# Patient Record
Sex: Female | Born: 1935 | Race: White | Hispanic: No | Marital: Married | Smoking: Never smoker
Health system: Southern US, Community
[De-identification: ages and names within clinical notes are randomized; demographics above are authoritative.]

## PROBLEM LIST (undated history)

## (undated) DIAGNOSIS — E1140 Type 2 diabetes mellitus with diabetic neuropathy, unspecified: Secondary | ICD-10-CM

## (undated) DIAGNOSIS — J449 Chronic obstructive pulmonary disease, unspecified: Secondary | ICD-10-CM

## (undated) DIAGNOSIS — K219 Gastro-esophageal reflux disease without esophagitis: Secondary | ICD-10-CM

## (undated) DIAGNOSIS — G459 Transient cerebral ischemic attack, unspecified: Secondary | ICD-10-CM

## (undated) DIAGNOSIS — E119 Type 2 diabetes mellitus without complications: Secondary | ICD-10-CM

## (undated) DIAGNOSIS — G709 Myoneural disorder, unspecified: Secondary | ICD-10-CM

## (undated) DIAGNOSIS — I2510 Atherosclerotic heart disease of native coronary artery without angina pectoris: Secondary | ICD-10-CM

## (undated) DIAGNOSIS — E785 Hyperlipidemia, unspecified: Secondary | ICD-10-CM

## (undated) HISTORY — DX: Hyperlipidemia, unspecified: E78.5

## (undated) HISTORY — PX: JOINT REPLACEMENT: SHX530

## (undated) HISTORY — PX: BACK SURGERY: SHX140

---
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
## 1973-12-10 HISTORY — PX: ABDOMINAL HYSTERECTOMY: SHX81

## 1998-03-21 ENCOUNTER — Ambulatory Visit (HOSPITAL_COMMUNITY): Admission: RE | Admit: 1998-03-21 | Discharge: 1998-03-21 | Payer: Self-pay | Admitting: Internal Medicine

## 1998-10-13 ENCOUNTER — Other Ambulatory Visit: Admission: RE | Admit: 1998-10-13 | Discharge: 1998-10-13 | Payer: Self-pay | Admitting: *Deleted

## 1999-03-27 ENCOUNTER — Ambulatory Visit (HOSPITAL_COMMUNITY): Admission: RE | Admit: 1999-03-27 | Discharge: 1999-03-27 | Payer: Self-pay | Admitting: *Deleted

## 1999-08-02 ENCOUNTER — Inpatient Hospital Stay (HOSPITAL_COMMUNITY): Admission: AD | Admit: 1999-08-02 | Discharge: 1999-08-04 | Payer: Self-pay | Admitting: Endocrinology

## 2000-01-04 ENCOUNTER — Other Ambulatory Visit: Admission: RE | Admit: 2000-01-04 | Discharge: 2000-01-04 | Payer: Self-pay | Admitting: *Deleted

## 2000-04-01 ENCOUNTER — Ambulatory Visit (HOSPITAL_COMMUNITY): Admission: RE | Admit: 2000-04-01 | Discharge: 2000-04-01 | Payer: Self-pay | Admitting: *Deleted

## 2000-10-11 ENCOUNTER — Ambulatory Visit (HOSPITAL_COMMUNITY): Admission: RE | Admit: 2000-10-11 | Discharge: 2000-10-11 | Payer: Self-pay | Admitting: *Deleted

## 2001-01-27 ENCOUNTER — Encounter (INDEPENDENT_AMBULATORY_CARE_PROVIDER_SITE_OTHER): Payer: Self-pay

## 2001-01-27 ENCOUNTER — Other Ambulatory Visit: Admission: RE | Admit: 2001-01-27 | Discharge: 2001-01-27 | Payer: Self-pay | Admitting: Gastroenterology

## 2001-03-28 ENCOUNTER — Encounter: Admission: RE | Admit: 2001-03-28 | Discharge: 2001-06-26 | Payer: Self-pay | Admitting: Endocrinology

## 2001-04-03 ENCOUNTER — Ambulatory Visit (HOSPITAL_COMMUNITY): Admission: RE | Admit: 2001-04-03 | Discharge: 2001-04-03 | Payer: Self-pay | Admitting: *Deleted

## 2002-04-07 ENCOUNTER — Ambulatory Visit (HOSPITAL_COMMUNITY): Admission: RE | Admit: 2002-04-07 | Discharge: 2002-04-07 | Payer: Self-pay | Admitting: Internal Medicine

## 2003-04-09 ENCOUNTER — Encounter: Payer: Self-pay | Admitting: Internal Medicine

## 2003-04-09 ENCOUNTER — Ambulatory Visit (HOSPITAL_COMMUNITY): Admission: RE | Admit: 2003-04-09 | Discharge: 2003-04-09 | Payer: Self-pay | Admitting: Internal Medicine

## 2003-06-23 ENCOUNTER — Encounter: Admission: RE | Admit: 2003-06-23 | Discharge: 2003-06-23 | Payer: Self-pay | Admitting: Internal Medicine

## 2003-06-23 ENCOUNTER — Encounter: Payer: Self-pay | Admitting: Internal Medicine

## 2004-04-14 ENCOUNTER — Ambulatory Visit (HOSPITAL_COMMUNITY): Admission: RE | Admit: 2004-04-14 | Discharge: 2004-04-14 | Payer: Self-pay | Admitting: Internal Medicine

## 2005-04-16 ENCOUNTER — Ambulatory Visit (HOSPITAL_COMMUNITY): Admission: RE | Admit: 2005-04-16 | Discharge: 2005-04-16 | Payer: Self-pay | Admitting: Internal Medicine

## 2006-04-18 ENCOUNTER — Ambulatory Visit (HOSPITAL_COMMUNITY): Admission: RE | Admit: 2006-04-18 | Discharge: 2006-04-18 | Payer: Self-pay | Admitting: Internal Medicine

## 2006-05-20 ENCOUNTER — Inpatient Hospital Stay (HOSPITAL_COMMUNITY): Admission: EM | Admit: 2006-05-20 | Discharge: 2006-05-22 | Payer: Self-pay | Admitting: Emergency Medicine

## 2006-05-21 ENCOUNTER — Encounter: Payer: Self-pay | Admitting: Cardiology

## 2006-05-21 ENCOUNTER — Ambulatory Visit: Payer: Self-pay | Admitting: Cardiology

## 2006-12-10 DIAGNOSIS — I2510 Atherosclerotic heart disease of native coronary artery without angina pectoris: Secondary | ICD-10-CM

## 2006-12-10 HISTORY — PX: CARDIAC CATHETERIZATION: SHX172

## 2006-12-10 HISTORY — DX: Atherosclerotic heart disease of native coronary artery without angina pectoris: I25.10

## 2006-12-20 ENCOUNTER — Encounter: Admission: RE | Admit: 2006-12-20 | Discharge: 2006-12-20 | Payer: Self-pay | Admitting: Internal Medicine

## 2007-04-21 ENCOUNTER — Ambulatory Visit (HOSPITAL_COMMUNITY): Admission: RE | Admit: 2007-04-21 | Discharge: 2007-04-21 | Payer: Self-pay | Admitting: Internal Medicine

## 2007-06-04 ENCOUNTER — Encounter: Admission: RE | Admit: 2007-06-04 | Discharge: 2007-06-04 | Payer: Self-pay | Admitting: Internal Medicine

## 2007-06-06 ENCOUNTER — Ambulatory Visit (HOSPITAL_COMMUNITY): Admission: RE | Admit: 2007-06-06 | Discharge: 2007-06-06 | Payer: Self-pay | Admitting: *Deleted

## 2007-06-19 ENCOUNTER — Ambulatory Visit (HOSPITAL_COMMUNITY): Admission: RE | Admit: 2007-06-19 | Discharge: 2007-06-19 | Payer: Self-pay | Admitting: Cardiovascular Disease

## 2007-07-03 ENCOUNTER — Ambulatory Visit: Payer: Self-pay | Admitting: Pulmonary Disease

## 2007-07-21 ENCOUNTER — Ambulatory Visit: Payer: Self-pay | Admitting: Pulmonary Disease

## 2007-07-22 ENCOUNTER — Ambulatory Visit: Payer: Self-pay | Admitting: Pulmonary Disease

## 2007-07-22 LAB — CONVERTED CEMR LAB
ALT: 16 units/L (ref 0–35)
AST: 16 units/L (ref 0–37)
Albumin: 3.1 g/dL — ABNORMAL LOW (ref 3.5–5.2)
Alkaline Phosphatase: 46 units/L (ref 39–117)
Angiotensin 1 Converting Enzyme: 29 units/L (ref 9–67)
Anti Nuclear Antibody(ANA): NEGATIVE
Basophils Absolute: 0 10*3/uL (ref 0.0–0.1)
Basophils Relative: 0.9 % (ref 0.0–1.0)
Calcium: 8.4 mg/dL (ref 8.4–10.5)
Chloride: 111 meq/L (ref 96–112)
Eosinophils Absolute: 0.1 10*3/uL (ref 0.0–0.6)
Eosinophils Relative: 1.3 % (ref 0.0–5.0)
GFR calc Af Amer: 92 mL/min
Glucose, Bld: 152 mg/dL — ABNORMAL HIGH (ref 70–99)
MCHC: 32.7 g/dL (ref 30.0–36.0)
MCV: 78.4 fL (ref 78.0–100.0)
Neutro Abs: 3.6 10*3/uL (ref 1.4–7.7)
Platelets: 184 10*3/uL (ref 150–400)
RBC: 3.88 M/uL (ref 3.87–5.11)
RDW: 17.5 % — ABNORMAL HIGH (ref 11.5–14.6)
Sed Rate: 10 mm/hr (ref 0–25)
Sodium: 142 meq/L (ref 135–145)
WBC: 5.3 10*3/uL (ref 4.5–10.5)

## 2007-08-12 ENCOUNTER — Ambulatory Visit (HOSPITAL_BASED_OUTPATIENT_CLINIC_OR_DEPARTMENT_OTHER): Admission: RE | Admit: 2007-08-12 | Discharge: 2007-08-12 | Payer: Self-pay | Admitting: Orthopaedic Surgery

## 2007-09-02 ENCOUNTER — Encounter: Admission: RE | Admit: 2007-09-02 | Discharge: 2007-12-01 | Payer: Self-pay | Admitting: Orthopaedic Surgery

## 2008-01-13 ENCOUNTER — Ambulatory Visit (HOSPITAL_COMMUNITY): Admission: RE | Admit: 2008-01-13 | Discharge: 2008-01-13 | Payer: Self-pay | Admitting: Cardiovascular Disease

## 2008-01-26 ENCOUNTER — Ambulatory Visit (HOSPITAL_COMMUNITY): Admission: RE | Admit: 2008-01-26 | Discharge: 2008-01-26 | Payer: Self-pay | Admitting: Cardiovascular Disease

## 2008-03-13 IMAGING — CT CT ANGIO CHEST
2 of 5 series · 18 of 36 positions shown · IV contrast (omnipaque)
Comparison: none

CLINICAL DATA: 70 year-old-female with shortness of breath and chest pain.  Evaluate for pulmonary embolism. 
CT ANGIOGRAPHY OF CHEST ? 06/19/07:
TECHNIQUE: Multidetector CT imaging of the chest was performed during bolus injection of intravenous contrast.  Multiplanar CT angiographic image reconstructions were generated to evaluate the vascular anatomy.
Contrast:  120 cc Omnipaque 300.

[Series 3: pe · axial · 0.81mm/px · z∈[-316,-28]mm · 15 of 262 slices shown]
[im 16/262  lung]
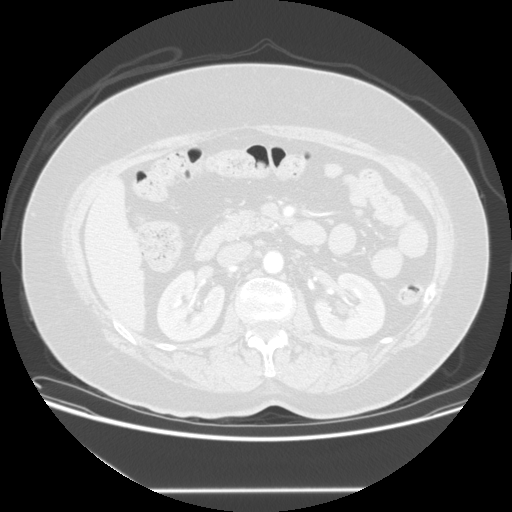
[im 31/262  mediastinal]
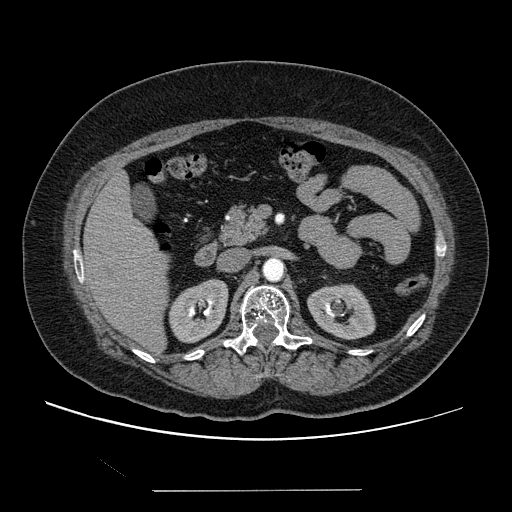
[im 47/262  lung]
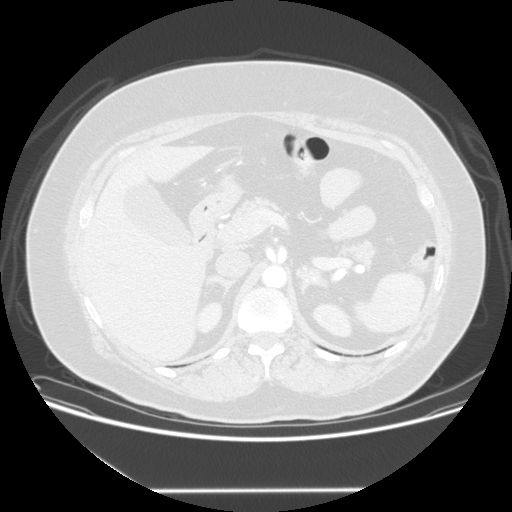
[im 62/262  mediastinal]
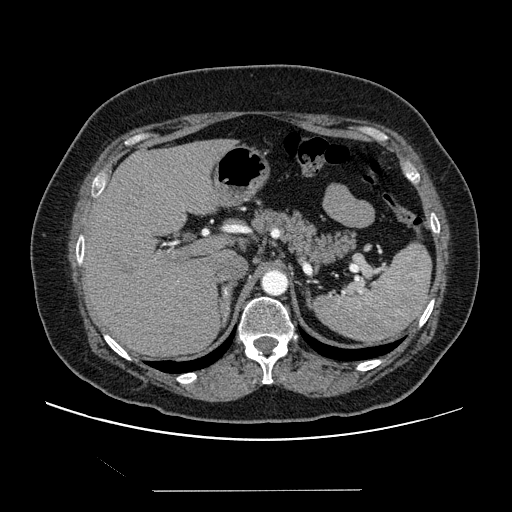
[im 77/262  lung]
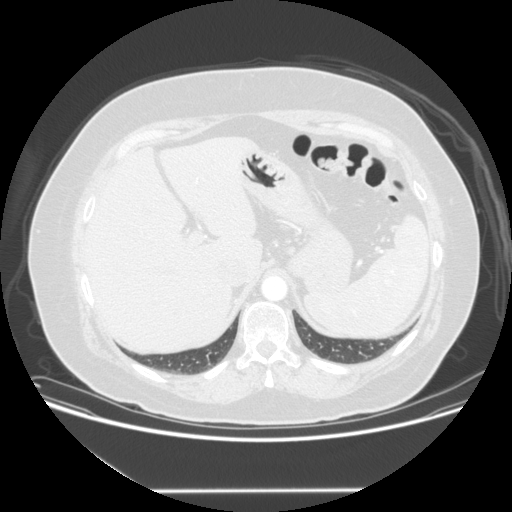
[im 93/262  mediastinal]
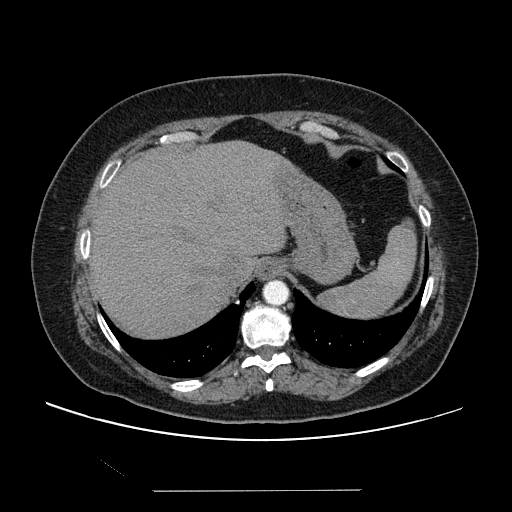
[im 108/262  lung]
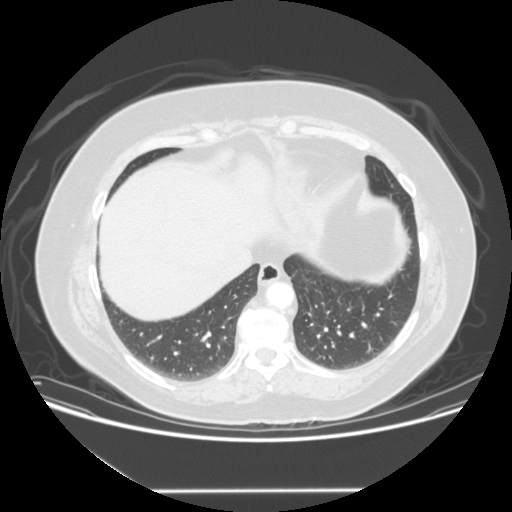
[im 139/262  mediastinal]
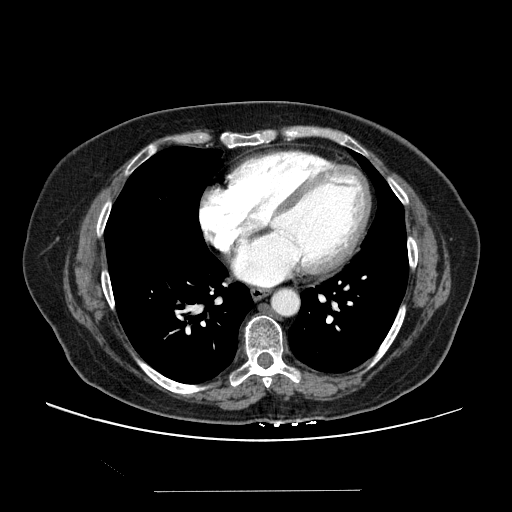
[im 154/262  lung]
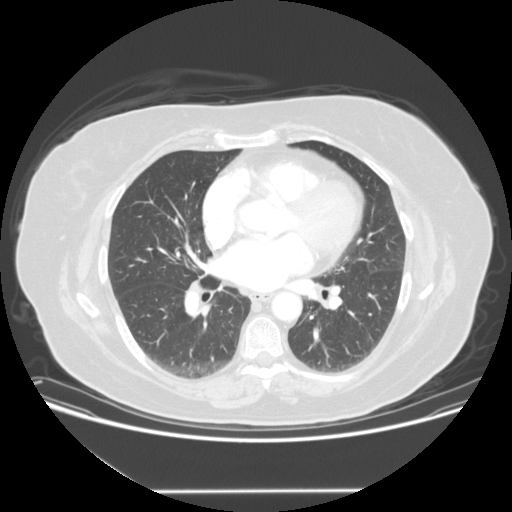
[im 169/262  mediastinal]
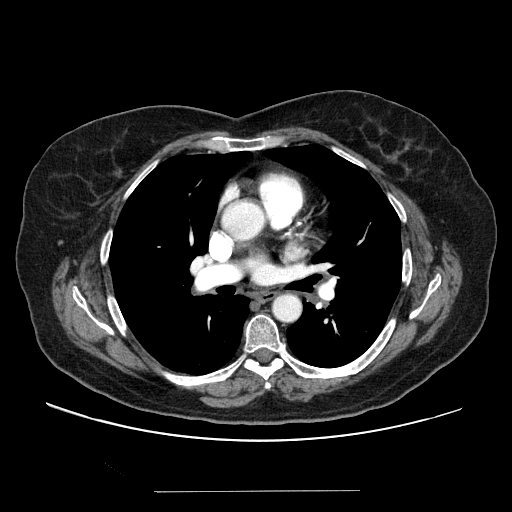
[im 185/262  lung]
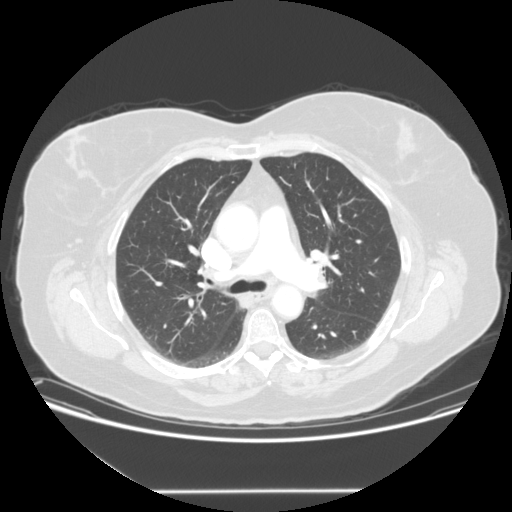
[im 200/262  mediastinal]
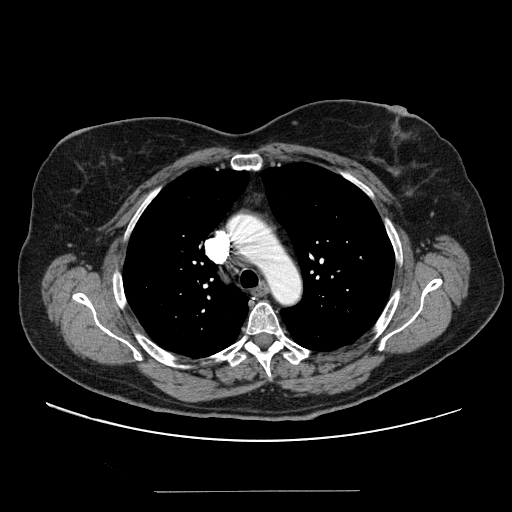
[im 215/262  lung]
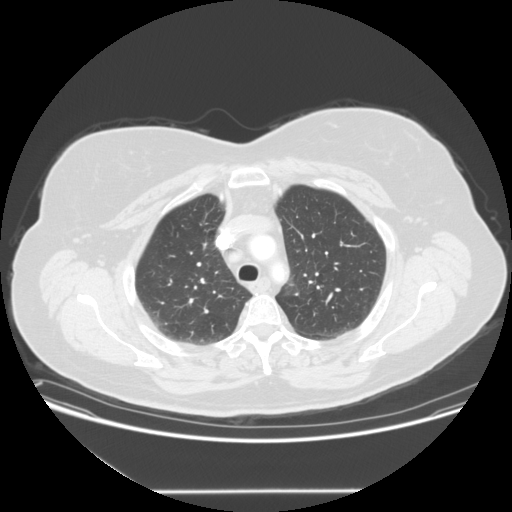
[im 231/262  mediastinal]
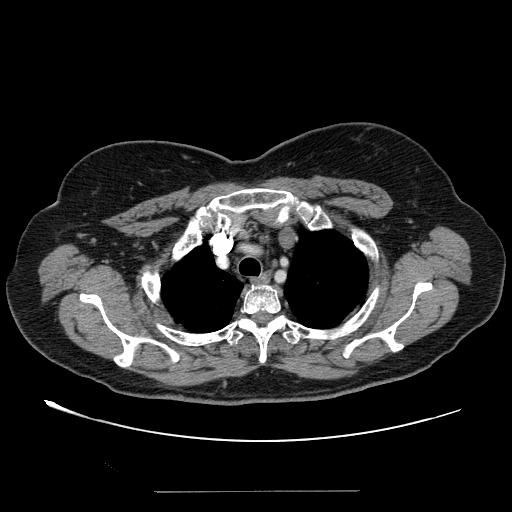
[im 246/262  lung]
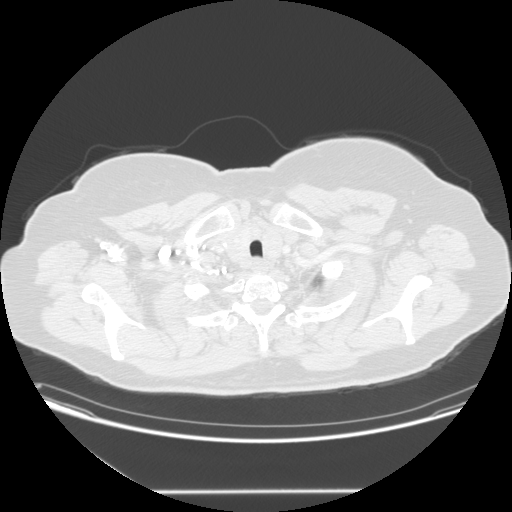

[Series 301: coronal · coronal · 0.81mm/px · 3 of 133 slices shown]
[im 27/133  mediastinal]
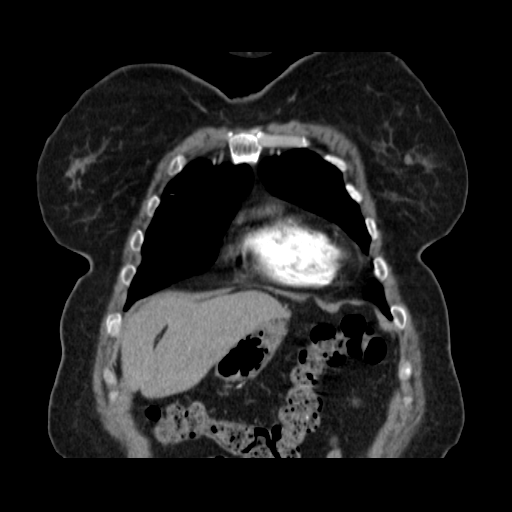
[im 53/133  mediastinal]
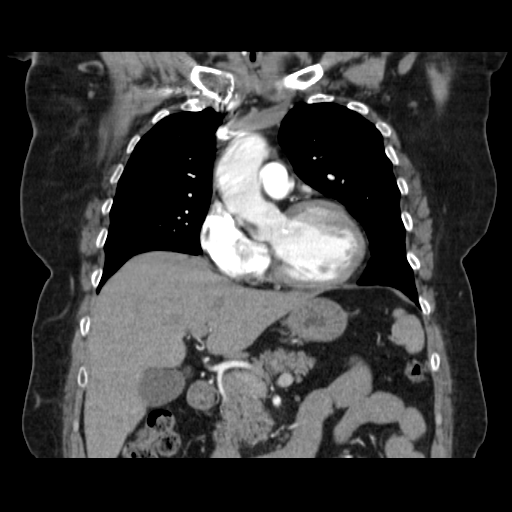
[im 80/133  mediastinal]
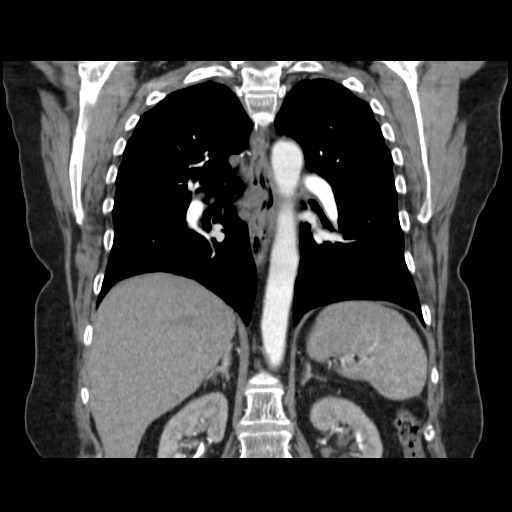

[18 of 36 positions shown; findings below may reference images not displayed]

FINDINGS: Incidentally, the patient has a common origin in the left common carotid artery and the right innominate artery consistent with a bovine arch.  This is a normal anatomic variant.   Otherwise the aorta has a normal configuration without dissection or aneurysmal dilatation.   There appears to be a fat containing lesion involving the right adrenal gland, probably representing a myelolipoma. There is also a small nodule containing a calcification and low density involving the left adrenal gland that measures 1.2 cm.  This is left adrenal lesion is indeterminate and could represent an area of old hemorrhage.   central area of low density suggests that this could represent a very small myelolipoma.   Suggestion for left peripelvic cysts.  Thyroid tissue is slightly prominent and extends into the substernal region.  No evidence to suggest a pulmonary embolism.  No significant chest lymphadenopathy.  No significant pericardial or pleural effusions.  The right adrenal myelolipoma measures 1.7 cm.  
The trachea and mainstem bronchi are patent.   Calcification in the right lung base.  Punctate nodule noted in the right major fissure measuring 3-4 mm on sequence 5, image #51.  There is also a nodule noted in the anterior right upper lobe measuring 6 mm on sequence 5, image #47.   Additional punctate nodule in the right apex on sequence 5, image #17.  A 5-6 mm nodule along the left major fissure on image 49, sequence 5.    Probable hemangioma involving the posterior body of L1.  Otherwise, there are no acute bone abnormalities.   There is some loss of bone along the posterior aspect of the L1 vertebral body which is probably related to this hemangioma.
IMPRESSION: 1.  Negative for pulmonary embolism. 
2.  Bilateral adrenal nodules.  most consistent with a myellipoma and the left adrenal  lesion is indeterminate. 
3.  Scattered small pulmonary nodules throughout the lungs.  These are nonspecific findings. Recommend a 3-6 month follow-up.

## 2008-04-23 ENCOUNTER — Ambulatory Visit (HOSPITAL_COMMUNITY): Admission: RE | Admit: 2008-04-23 | Discharge: 2008-04-23 | Payer: Self-pay | Admitting: Internal Medicine

## 2009-01-21 ENCOUNTER — Inpatient Hospital Stay (HOSPITAL_COMMUNITY): Admission: RE | Admit: 2009-01-21 | Discharge: 2009-02-02 | Payer: Self-pay | Admitting: Orthopaedic Surgery

## 2009-02-01 ENCOUNTER — Ambulatory Visit: Payer: Self-pay | Admitting: Surgery

## 2009-02-01 ENCOUNTER — Encounter (INDEPENDENT_AMBULATORY_CARE_PROVIDER_SITE_OTHER): Payer: Self-pay | Admitting: Orthopaedic Surgery

## 2009-03-21 ENCOUNTER — Encounter: Admission: RE | Admit: 2009-03-21 | Discharge: 2009-06-19 | Payer: Self-pay | Admitting: Orthopaedic Surgery

## 2009-05-04 ENCOUNTER — Ambulatory Visit (HOSPITAL_COMMUNITY): Admission: RE | Admit: 2009-05-04 | Discharge: 2009-05-04 | Payer: Self-pay | Admitting: Internal Medicine

## 2009-06-20 ENCOUNTER — Encounter: Admission: RE | Admit: 2009-06-20 | Discharge: 2009-08-08 | Payer: Self-pay | Admitting: Orthopaedic Surgery

## 2009-08-04 ENCOUNTER — Inpatient Hospital Stay (HOSPITAL_COMMUNITY): Admission: RE | Admit: 2009-08-04 | Discharge: 2009-08-10 | Payer: Self-pay | Admitting: Orthopaedic Surgery

## 2009-08-08 ENCOUNTER — Encounter (INDEPENDENT_AMBULATORY_CARE_PROVIDER_SITE_OTHER): Payer: Self-pay | Admitting: Orthopaedic Surgery

## 2009-09-26 ENCOUNTER — Encounter: Admission: RE | Admit: 2009-09-26 | Discharge: 2009-12-08 | Payer: Self-pay | Admitting: Orthopaedic Surgery

## 2009-12-08 ENCOUNTER — Encounter
Admission: RE | Admit: 2009-12-08 | Discharge: 2010-03-08 | Payer: Self-pay | Source: Home / Self Care | Admitting: Orthopaedic Surgery

## 2010-05-05 ENCOUNTER — Ambulatory Visit (HOSPITAL_COMMUNITY): Admission: RE | Admit: 2010-05-05 | Discharge: 2010-05-05 | Payer: Self-pay | Admitting: Internal Medicine

## 2010-12-13 ENCOUNTER — Ambulatory Visit (HOSPITAL_COMMUNITY)
Admission: RE | Admit: 2010-12-13 | Discharge: 2010-12-13 | Payer: Self-pay | Source: Home / Self Care | Attending: Internal Medicine | Admitting: Internal Medicine

## 2011-03-16 LAB — GLUCOSE, CAPILLARY: Glucose-Capillary: 174 mg/dL — ABNORMAL HIGH (ref 70–99)

## 2011-03-16 LAB — PROTIME-INR
INR: 2.3 — ABNORMAL HIGH (ref 0.00–1.49)
Prothrombin Time: 25.1 seconds — ABNORMAL HIGH (ref 11.6–15.2)

## 2011-03-17 LAB — URINALYSIS, MICROSCOPIC ONLY
Glucose, UA: 500 mg/dL — AB
Hgb urine dipstick: NEGATIVE
Protein, ur: NEGATIVE mg/dL

## 2011-03-17 LAB — BASIC METABOLIC PANEL
BUN: 15 mg/dL (ref 6–23)
BUN: 9 mg/dL (ref 6–23)
BUN: 9 mg/dL (ref 6–23)
CO2: 28 mEq/L (ref 19–32)
CO2: 28 mEq/L (ref 19–32)
Calcium: 7.7 mg/dL — ABNORMAL LOW (ref 8.4–10.5)
Calcium: 8 mg/dL — ABNORMAL LOW (ref 8.4–10.5)
Chloride: 103 mEq/L (ref 96–112)
Chloride: 103 mEq/L (ref 96–112)
Creatinine, Ser: 0.78 mg/dL (ref 0.4–1.2)
Creatinine, Ser: 0.86 mg/dL (ref 0.4–1.2)
Creatinine, Ser: 0.95 mg/dL (ref 0.4–1.2)
GFR calc Af Amer: >60 mL/min (ref >60)
GFR calc non Af Amer: >60 mL/min (ref >60)
GFR calc non Af Amer: >60 mL/min (ref >60)
Glucose, Bld: 181 mg/dL — ABNORMAL HIGH (ref 70–99)
Glucose, Bld: 211 mg/dL — ABNORMAL HIGH (ref 70–99)
Glucose, Bld: 253 mg/dL — ABNORMAL HIGH (ref 70–99)
Potassium: 4.5 mEq/L (ref 3.5–5.1)
Sodium: 137 mEq/L (ref 135–145)

## 2011-03-17 LAB — CBC
HCT: 24.1 % — ABNORMAL LOW (ref 36.0–46.0)
HCT: 30.4 % — ABNORMAL LOW (ref 36.0–46.0)
HCT: 41.5 % (ref 36.0–46.0)
Hemoglobin: 9.7 g/dL — ABNORMAL LOW (ref 12.0–15.0)
MCHC: 33.5 g/dL (ref 30.0–36.0)
MCHC: 33.9 g/dL (ref 30.0–36.0)
MCHC: 34 g/dL (ref 30.0–36.0)
MCV: 101.2 fL — ABNORMAL HIGH (ref 78.0–100.0)
MCV: 101.6 fL — ABNORMAL HIGH (ref 78.0–100.0)
Platelets: 103 10*3/uL — ABNORMAL LOW (ref 150–400)
Platelets: 109 10*3/uL — ABNORMAL LOW (ref 150–400)
Platelets: 124 10*3/uL — ABNORMAL LOW (ref 150–400)
Platelets: 154 10*3/uL (ref 150–400)
RBC: 2.82 MIL/uL — ABNORMAL LOW (ref 3.87–5.11)
RDW: 13.7 % (ref 11.5–15.5)
RDW: 14.6 % (ref 11.5–15.5)
RDW: 15.4 % (ref 11.5–15.5)
WBC: 5.5 10*3/uL (ref 4.0–10.5)

## 2011-03-17 LAB — GLUCOSE, CAPILLARY
Glucose-Capillary: 117 mg/dL — ABNORMAL HIGH (ref 70–99)
Glucose-Capillary: 118 mg/dL — ABNORMAL HIGH (ref 70–99)
Glucose-Capillary: 136 mg/dL — ABNORMAL HIGH (ref 70–99)
Glucose-Capillary: 139 mg/dL — ABNORMAL HIGH (ref 70–99)
Glucose-Capillary: 141 mg/dL — ABNORMAL HIGH (ref 70–99)
Glucose-Capillary: 151 mg/dL — ABNORMAL HIGH (ref 70–99)
Glucose-Capillary: 151 mg/dL — ABNORMAL HIGH (ref 70–99)
Glucose-Capillary: 156 mg/dL — ABNORMAL HIGH (ref 70–99)
Glucose-Capillary: 165 mg/dL — ABNORMAL HIGH (ref 70–99)
Glucose-Capillary: 172 mg/dL — ABNORMAL HIGH (ref 70–99)
Glucose-Capillary: 214 mg/dL — ABNORMAL HIGH (ref 70–99)
Glucose-Capillary: 215 mg/dL — ABNORMAL HIGH (ref 70–99)
Glucose-Capillary: 282 mg/dL — ABNORMAL HIGH (ref 70–99)

## 2011-03-17 LAB — ABO/RH: ABO/RH(D): A POS

## 2011-03-17 LAB — CROSSMATCH

## 2011-03-17 LAB — CARDIAC PANEL(CRET KIN+CKTOT+MB+TROPI)
CK, MB: 0.5 ng/mL (ref 0.3–4.0)
CK, MB: 0.9 ng/mL (ref 0.3–4.0)
Relative Index: INVALID (ref 0.0–2.5)
Relative Index: INVALID (ref 0.0–2.5)
Total CK: 30 U/L (ref 7–177)
Total CK: 36 U/L (ref 7–177)
Troponin I: 0.01 ng/mL (ref 0.00–0.06)
Troponin I: <0.01 ng/mL (ref 0.00–0.06)

## 2011-03-17 LAB — PROTIME-INR
INR: 1.8 — ABNORMAL HIGH (ref 0.00–1.49)
INR: 2 — ABNORMAL HIGH (ref 0.00–1.49)
INR: 2.2 — ABNORMAL HIGH (ref 0.00–1.49)
INR: 2.6 — ABNORMAL HIGH (ref 0.00–1.49)
Prothrombin Time: 22.7 seconds — ABNORMAL HIGH (ref 11.6–15.2)
Prothrombin Time: 27.6 seconds — ABNORMAL HIGH (ref 11.6–15.2)

## 2011-03-17 LAB — PREPARE RBC (CROSSMATCH)

## 2011-03-17 LAB — TRANSFUSION REACTION

## 2011-03-17 LAB — D-DIMER, QUANTITATIVE: D-Dimer, Quant: 1.27 ug/mL-FEU — ABNORMAL HIGH (ref 0.00–0.48)

## 2011-03-27 LAB — CBC
HCT: 23.2 % — ABNORMAL LOW (ref 36.0–46.0)
HCT: 27.9 % — ABNORMAL LOW (ref 36.0–46.0)
HCT: 29.6 % — ABNORMAL LOW (ref 36.0–46.0)
Hemoglobin: 14.8 g/dL (ref 12.0–15.0)
Hemoglobin: 8 g/dL — ABNORMAL LOW (ref 12.0–15.0)
Hemoglobin: 9.4 g/dL — ABNORMAL LOW (ref 12.0–15.0)
MCHC: 34.5 g/dL (ref 30.0–36.0)
MCHC: 34.6 g/dL (ref 30.0–36.0)
MCHC: 34.9 g/dL (ref 30.0–36.0)
MCV: 97.9 fL (ref 78.0–100.0)
Platelets: 100 10*3/uL — ABNORMAL LOW (ref 150–400)
Platelets: 112 10*3/uL — ABNORMAL LOW (ref 150–400)
Platelets: 141 10*3/uL — ABNORMAL LOW (ref 150–400)
Platelets: 126 10*3/uL — ABNORMAL LOW (ref 150–400)
Platelets: 208 10*3/uL (ref 150–400)
Platelets: 300 10*3/uL (ref 150–400)
RBC: 2.37 MIL/uL — ABNORMAL LOW (ref 3.87–5.11)
RBC: 2.85 MIL/uL — ABNORMAL LOW (ref 3.87–5.11)
RBC: 2.72 MIL/uL — ABNORMAL LOW (ref 3.87–5.11)
RBC: 2.82 MIL/uL — ABNORMAL LOW (ref 3.87–5.11)
RDW: 14.3 % (ref 11.5–15.5)
RDW: 14.8 % (ref 11.5–15.5)
WBC: 5.5 10*3/uL (ref 4.0–10.5)
WBC: 5.1 10*3/uL (ref 4.0–10.5)
WBC: 5.8 10*3/uL (ref 4.0–10.5)
WBC: 7 10*3/uL (ref 4.0–10.5)

## 2011-03-27 LAB — GLUCOSE, CAPILLARY
Glucose-Capillary: 131 mg/dL — ABNORMAL HIGH (ref 70–99)
Glucose-Capillary: 138 mg/dL — ABNORMAL HIGH (ref 70–99)
Glucose-Capillary: 140 mg/dL — ABNORMAL HIGH (ref 70–99)
Glucose-Capillary: 146 mg/dL — ABNORMAL HIGH (ref 70–99)
Glucose-Capillary: 151 mg/dL — ABNORMAL HIGH (ref 70–99)
Glucose-Capillary: 157 mg/dL — ABNORMAL HIGH (ref 70–99)
Glucose-Capillary: 166 mg/dL — ABNORMAL HIGH (ref 70–99)
Glucose-Capillary: 172 mg/dL — ABNORMAL HIGH (ref 70–99)
Glucose-Capillary: 185 mg/dL — ABNORMAL HIGH (ref 70–99)
Glucose-Capillary: 194 mg/dL — ABNORMAL HIGH (ref 70–99)
Glucose-Capillary: 112 mg/dL — ABNORMAL HIGH (ref 70–99)
Glucose-Capillary: 112 mg/dL — ABNORMAL HIGH (ref 70–99)
Glucose-Capillary: 112 mg/dL — ABNORMAL HIGH (ref 70–99)
Glucose-Capillary: 116 mg/dL — ABNORMAL HIGH (ref 70–99)
Glucose-Capillary: 118 mg/dL — ABNORMAL HIGH (ref 70–99)
Glucose-Capillary: 120 mg/dL — ABNORMAL HIGH (ref 70–99)
Glucose-Capillary: 121 mg/dL — ABNORMAL HIGH (ref 70–99)
Glucose-Capillary: 121 mg/dL — ABNORMAL HIGH (ref 70–99)
Glucose-Capillary: 123 mg/dL — ABNORMAL HIGH (ref 70–99)
Glucose-Capillary: 124 mg/dL — ABNORMAL HIGH (ref 70–99)
Glucose-Capillary: 141 mg/dL — ABNORMAL HIGH (ref 70–99)
Glucose-Capillary: 150 mg/dL — ABNORMAL HIGH (ref 70–99)
Glucose-Capillary: 152 mg/dL — ABNORMAL HIGH (ref 70–99)
Glucose-Capillary: 162 mg/dL — ABNORMAL HIGH (ref 70–99)
Glucose-Capillary: 166 mg/dL — ABNORMAL HIGH (ref 70–99)
Glucose-Capillary: 187 mg/dL — ABNORMAL HIGH (ref 70–99)
Glucose-Capillary: 57 mg/dL — ABNORMAL LOW (ref 70–99)
Glucose-Capillary: 78 mg/dL (ref 70–99)

## 2011-03-27 LAB — PROTIME-INR
INR: 1.4 (ref 0.00–1.49)
INR: 1.6 — ABNORMAL HIGH (ref 0.00–1.49)
INR: 2.1 — ABNORMAL HIGH (ref 0.00–1.49)
INR: 2.3 — ABNORMAL HIGH (ref 0.00–1.49)
INR: 2.5 — ABNORMAL HIGH (ref 0.00–1.49)
INR: 2.5 — ABNORMAL HIGH (ref 0.00–1.49)
INR: 2.8 — ABNORMAL HIGH (ref 0.00–1.49)
INR: 3.3 — ABNORMAL HIGH (ref 0.00–1.49)
INR: 3.8 — ABNORMAL HIGH (ref 0.00–1.49)
Prothrombin Time: 14.2 seconds (ref 11.6–15.2)
Prothrombin Time: 14.6 seconds (ref 11.6–15.2)
Prothrombin Time: 27.7 seconds — ABNORMAL HIGH (ref 11.6–15.2)
Prothrombin Time: 28.3 seconds — ABNORMAL HIGH (ref 11.6–15.2)
Prothrombin Time: 28.4 seconds — ABNORMAL HIGH (ref 11.6–15.2)
Prothrombin Time: 30.2 seconds — ABNORMAL HIGH (ref 11.6–15.2)
Prothrombin Time: 32 seconds — ABNORMAL HIGH (ref 11.6–15.2)
Prothrombin Time: 36.5 seconds — ABNORMAL HIGH (ref 11.6–15.2)
Prothrombin Time: 40.6 seconds — ABNORMAL HIGH (ref 11.6–15.2)

## 2011-03-27 LAB — BASIC METABOLIC PANEL
BUN: 13 mg/dL (ref 6–23)
BUN: 7 mg/dL (ref 6–23)
BUN: 8 mg/dL (ref 6–23)
CO2: 31 mEq/L (ref 19–32)
Calcium: 7.5 mg/dL — ABNORMAL LOW (ref 8.4–10.5)
Calcium: 7.7 mg/dL — ABNORMAL LOW (ref 8.4–10.5)
Calcium: 9 mg/dL (ref 8.4–10.5)
Calcium: 8.3 mg/dL — ABNORMAL LOW (ref 8.4–10.5)
Chloride: 100 mEq/L (ref 96–112)
Creatinine, Ser: 0.91 mg/dL (ref 0.4–1.2)
Creatinine, Ser: 0.84 mg/dL (ref 0.4–1.2)
GFR calc Af Amer: >60 mL/min (ref >60)
GFR calc Af Amer: >60 mL/min (ref >60)
GFR calc Af Amer: >60 mL/min (ref >60)
GFR calc non Af Amer: >60 mL/min (ref >60)
GFR calc non Af Amer: >60 mL/min (ref >60)
GFR calc non Af Amer: >60 mL/min (ref >60)
GFR calc non Af Amer: >60 mL/min (ref >60)
Glucose, Bld: 142 mg/dL — ABNORMAL HIGH (ref 70–99)
Glucose, Bld: 151 mg/dL — ABNORMAL HIGH (ref 70–99)
Potassium: 3.8 mEq/L (ref 3.5–5.1)
Potassium: 4.2 mEq/L (ref 3.5–5.1)
Sodium: 134 mEq/L — ABNORMAL LOW (ref 135–145)
Sodium: 140 mEq/L (ref 135–145)

## 2011-03-27 LAB — CROSSMATCH
ABO/RH(D): A POS
Antibody Screen: NEGATIVE

## 2011-04-24 NOTE — Cardiovascular Report (Signed)
Lisa Gardner, Lisa Gardner             ACCOUNT NO.:  [id number]   MEDICAL RECORD NO.:  [id number]          PATIENT TYPE:  OIB   LOCATION:  2899                         FACILITY:  [facility]   PHYSICIAN:  Darlin Priestly, MD  DATE OF BIRTH:  06/29/1936   DATE OF PROCEDURE:  06/06/2007  DATE OF DISCHARGE:                            CARDIAC CATHETERIZATION   PROCEDURES:  1. Left heart catheterization.  2. Coronary angiography.  3. Left ventriculogram.   COMPLICATIONS:  None.   INDICATIONS:  [name] is a 75 year old female patient of Dr. Garner Nash and Dr.  Daphene Jaeger with a history of diabetes, hypertension,  history of cardiac catheterization November 2005 revealing mild left  main disease of 50% mid RCA and normal EF.  Over the last several weeks,  she has noted increasing shortness of breath with chest tightness and  lower extremity edema.  She did undergo lower extremity venous Doppler  in the office yesterday which ruled out DVT.  She is now brought for  cardiac catheterization to reassess her coronary anatomy.   DESCRIPTION OF OPERATION:  After obtaining informed consent, the patient  was brought to the cardiac cath lab and right and left groin shaved,  prepped and draped in usual sterile fashion.  Monitors were established.  Using modified Seldinger technique, a #6-French anterior sheath was  inserted into the right femoral artery.  A 6-French diagnostic catheter  was used to perform diagnostic angiography.   Left main is a large vessel with mild 30% ostial narrowing.   The LAD is a medium-size vessel coursing back to one diagonal branch.  The LAD had mild proximal calcification with no high-grade stenosis.   First diagonal is a medium-size vessel which bifurcates distally with a  40% ostial lesion.   Left circumflex is large vessel coursing the AV groove and goes to three  obtuse marginal branches.  The AV circumflex has no significant disease.   First OM is a medium  sized vessel which bifurcates distally with no  significant disease.   Second OM is a small to medium-size vessel with no significant disease.   Third OM is a medium-size vessel with no significant disease.   The right coronary artery is a large vessel which is dominate.  It gives  rise to both PDA as well posterolateral branch.  There is 50% mid RCA  narrowing, but no further high-grade stenosis.  The PDA and  posterolateral branch have no significant disease.   Left ventriculogram was a preserved EF of 60%.   HEMODYNAMIC SYSTEM:  Arterial pressure 120/52, LV system pressure 120/1,  LVDP of 8.   CONCLUSION:  1. Noncritical CAD.  2. Normal LV systolic function.      Darlin Priestly, MD  Electronically Signed     RHM/MEDQ  D:  06/06/2007  T:  06/06/2007  Job:  [id number]   cc:   Olene Craven, M.D.  Nicki Guadalajara, M.D.

## 2011-04-24 NOTE — Assessment & Plan Note (Signed)
[facility]                             PULMONARY OFFICE NOTE   [name], [name]                    MRN:          [id number]  DATE:07/29/2007                            DOB:          1936-04-06    I have reviewed Lisa Gardner's laboratory results and of note is that  her hemoglobin was 10 and her hematocrit was 30.4 and her glucose was  152.  Otherwise, the remainder of her laboratory tests were  unremarkable.  I have discussed these results with Lisa Gardner.  Certainly, there is not anything really in these lab tests which would  explain her symptoms of dyspnea.  I have advised her to follow up with  her primary care physician for further assessment of her anemia and her  hyperglycemia.     Coralyn Helling, MD  Electronically Signed    VS/MedQ  DD: 07/29/2007  DT: 07/30/2007  Job #: [id number]   cc:   Olene Craven, M.D.  Nicki Guadalajara, M.D.

## 2011-04-24 NOTE — Op Note (Signed)
Lisa Gardner, Lisa Gardner             ACCOUNT NO.:  [id number]   MEDICAL RECORD NO.:  [id number]          PATIENT TYPE:  AMB   LOCATION:  DSC                          FACILITY:  [facility]   PHYSICIAN:  Vanita Panda. Magnus Ivan, M.D.DATE OF BIRTH:  12-29-1935   DATE OF PROCEDURE:  08/12/2007  DATE OF DISCHARGE:                               OPERATIVE REPORT   PREOPERATIVE DIAGNOSIS:  Right knee severe degenerative joint disease.   POSTOPERATIVE DIAGNOSES:  1. Right knee severe degenerative joint disease.  2. Grade 4 chondromalacia, medial and lateral tibial plateaus.  3. Chronic posterior horn tearing, medial meniscus.  4. Chronic lateral meniscal midbody tear.  5. Grade 4 chondromalacia patella.  6. Grade 3 chondromalacia medial and lateral femoral condyles   SURGICAL PROCEDURES:  1. Right knee arthroscopy with extensive debridement.  2. Right knee arthroscopy, partial medial and lateral meniscectomies.  3. Chondroplasty right knee lateral tibial plateau and lateral femoral      condyle.   SURGEON:  Doneen Poisson, MD   ANESTHESIA:  General.   BLOOD LOSS:  Minimal.   TOURNIQUET TIME:  28 minutes.   COMPLICATIONS:  None.   INDICATIONS:  Brief Lisa Gardner is 75 year old who had debilitating  pain in both her knees.  She wears knee braces for her knees.  She has  had injections in her knees and has x-ray evidence of arthritic changes  in the knees.  She wanted to try to temporize this with arthroscopic  surgery in light of the potential for total knee arthroplasty.  She  wants to try to avoid this because she and her elderly husband care for  a disabled child at home.   PROCEDURE:  After informed consent obtained, appropriate right leg was  marked.  She was brought to the operating room, placed supine on the  operative table.  General anesthesia was then obtained.  Her right leg  was prepped and draped with DuraPrep and sterile drapes including  sterile stockinette.  A  nonsterile tourniquet had previously been placed  on her upper right thigh.  A time-out was called to identify the correct  patient, correct extremity.  I then made an anterior lateral portal just  lateral patellar tendon and then at the inferior pole of patella and a  cannula was inserted and the scope was inserted medially.  I made a  anterior medial portal just medial to the patellar tendon.  Arthroscopic  shaver was inserted from this standpoint.  You could see there was  chronic tearing of the medial meniscus that extended back to the  posterior aspect and we performed a partial medial meniscectomy using  upcutting biters as well as an arthroscopic shaver.  There was an area  of the tibial plateau on the medial side measuring approximately 5-6 mm  in diameter that showed grade 4 chondromalacia.  The lateral compartment  was assessed next and was found to have a large area of denuded  cartilage off the tibial plateau and the femoral condyle and meniscal  tearing of the lateral meniscus as well.  Of note she does have a valgus  deformity  of the knee.  With arthroscopic shaver and upcutting biters we  performed as extensive a debridement as possible including partial  lateral meniscectomy as well as debriding free edges of cartilage.  Finally the patellofemoral joint was assessed and actually the trochlea  itself was with minimal changes, but the patella on the lateral facet  had grade 4 chondromalacia changes as well.  I then allowed fluid to  lavage through the knee and removed all instrumentation and infusion was  removed from the knee and I inserted a mixture of 4 mg of morphine 0.25%  Marcaine with epinephrine into the knee.  The portal sites were each  closed with interrupted 4-0 nylon suture.  Xeroform followed by well-  padded sterile dressing was applied.  The tourniquet was inflated during  the case at 350 mm pressure, was let down at 28 minutes, toes did pink  nicely.  The  patient was awakened, extubated, taken to recovery room in  stable condition.  Postoperatively she will follow-up in the office in  two weeks and I will let her weight bear as tolerated.      Vanita Panda. Magnus Ivan, M.D.  Electronically Signed     CYB/MEDQ  D:  08/12/2007  T:  08/12/2007  Job:  [id number]

## 2011-04-24 NOTE — Assessment & Plan Note (Signed)
[facility]                             PULMONARY OFFICE NOTE   NEKA, BISE                    MRN:          [id number]  DATE:07/03/2007                            DOB:          May 07, 1936    REFERRING PHYSICIAN:  Nicki Guadalajara, M.D.   I met Ms. Callas today for evaluation of her dyspnea.   She has been having this problem for the last few months and she has  been complaining of chest tightness with occasional nausea.  She has had  an evaluation with a cardiac catheterization on June 27th which showed  normal left ventricular function and noncritical coronary artery  disease.  She was also having some leg swelling and had a Doppler study  of her legs done on June 26th which was negative for DVT.  She then had  a CT scan of her chest on July 10th which was negative for pulmonary  embolism which showed small scattered pulmonary nodules. She also had a  chest x-ray done on June 25th which does not show any active disease but  was suspicious for changes of COPD.  She says she has also had a  decrease in her appetite as well as stiffness in her joints.  She denies  having much as far as cough or sputum production.  There is no history  of hemoptysis.  She has not had any skin rashes.  There is no  significant travel history.  She did not have any exposure to animal,  pets or birds.  She has not had any recent sick exposures.  She says  that she is able to walk reasonably okay on level ground but her  problems are more so when she is walking up and down stairs.  She has  not had pulmonary function testing done before.   PAST MEDICAL HISTORY:  Significant for:  1. Diabetes.  2. Mini-stroke.   PAST SURGICAL HISTORY:  Significant for:  1. Back surgery.  2. Hysterectomy.  3. Appendectomy.  4. Tonsillectomy.   CURRENT MEDICATIONS:  1. Metformin 500 mg daily and 1000 mg b.i.d.  2. Actos 45 mg daily.  3. Lantus insulin 20 units daily.  4.  Amaryl 4 mg b.i.d.  5. Cymbalta 60 mg daily.  6. Nitro pill daily.  7. Lasix 80 mg daily.  8. Lipitor 40 mg daily.  9. Ocuvite b.i.d.  10.Neurontin 300 mg t.i.d.  11.Aspirin 325 mg daily.  12.Potassium 20 mEq daily.  13.Multivitamin daily.  14.Vitamin C daily.  15.Calcium daily.  16.Glucosamine b.i.d.   She has no known drug allergies.   SOCIAL HISTORY:  1. She is married.  2. There is no history of tobacco or alcohol use.   FAMILY HISTORY:  Significant for:  1. Her father who had colon cancer.  2. She has 2 brothers with heart disease.  3. She has a daughter with multiple sclerosis.  4. A sister with rheumatoid arthritis.  5. A mother who had polycystic kidney disease.   REVIEW OF SYSTEMS:  She does have problems snoring.   PHYSICAL EXAM:  She is 5  feet 4 inches all, 192 pounds, temperature  97.9, blood pressure 106/58, heart rate 74, oxygen saturation 97% on  room air.  HEENT:  Pupils reactive.  There is no sinus tenderness, no nasal  discharge, no oral lesions, no lymphadenopathy.  HEART:  S1, S2.  CHEST:  No wheezing or rales.  ABDOMEN:  Obese, soft, nontender.  EXTREMITIES:  No edema, cyanosis, clubbing.  NEUROLOGIC EXAM:  No focal deficits were appreciated.   IMPRESSION:  1. Dyspnea with a concern of chronic obstructive pulmonary disease      based on her chest x-ray.  However, she does not have any history      of prior tobacco use.  What I would like to do is have her undergo      pulmonary function test to further assess this and determine if she      would benefit from the use of inhaler therapy.  If her pulmonary      function tests are unremarkable then we may need to consider having      her undergo cardiopulmonary exercise testing.  2. Pulmonary nodules.  What I would recommend is followup CT scan in 3-      6 months to document stability.   I will follow up with her in approximately 2-3 weeks after I have a  chance to review her pulmonary function  test.     Coralyn Helling, MD  Electronically Signed    VS/MedQ  DD: 07/09/2007  DT: 07/10/2007  Job #: [id number]   cc:   Nicki Guadalajara, M.D.

## 2011-04-24 NOTE — Op Note (Signed)
NAMEMOLLYE, GUINTA NO.:  [id number]   MEDICAL RECORD NO.:  [id number]          PATIENT TYPE:  INP   LOCATION:  5030                         FACILITY:  [facility]   PHYSICIAN:  Vanita Panda. Magnus Ivan, M.D.DATE OF BIRTH:  February 01, 1936   DATE OF PROCEDURE:  01/21/2009  DATE OF DISCHARGE:                               OPERATIVE REPORT   PREOPERATIVE DIAGNOSIS:  Right knee severe osteoarthritis and  degenerative joint disease.   POSTOPERATIVE DIAGNOSIS:  Right knee severe osteoarthritis and  degenerative joint disease.   PROCEDURE:  Right total knee arthroplasty utilizing computer navigation.   IMPLANTS:  DePuy rotating platform knee with size 3 femur, size 3 tibial  tray, 12.5-mm polyethylene insert and 32-mm patellar button.  transcriptions put by   SURGEON:  Vanita Panda. Magnus Ivan, MD   ASSISTANT:  Wende Neighbors, PA-C   ANESTHESIA:  1. Regional right femoral block.  2. General.   ANTIBIOTICS:  Ancef 1g IV.   TOURNIQUET TIME:  1 hour and 16 minutes.   BLOOD LOSS:  100 mL.   COMPLICATIONS:  None.   INDICATIONS:  Briefly, Ms. Delair is a 75 year old female with severe  debilitating arthritis involving both her knees with the right being  worse than the left.  I have seen her for quite sometime and tried  injections in her knees and other modalities to try to calm down her  symptoms.  It has gone to where it is having a profound effect on her  activities of daily living and she wished to proceed with total knee  replacement surgery.  The risks and benefits of this were explained to  her in length including the risk of blood loss, DVT and fatal PE.   PROCEDURE DESCRIPTION:  After informed consent was obtained, the  appropriate right leg was marked and anesthesia obtained with a femoral  nerve block.  She was then brought to the operating room and placed  supine on the operating table.  General anesthesia was then obtained.  A  nonsterile tourniquet  was placed on her upper right thigh.  A Foley  catheter was then placed and then her right leg was prepped and draped  with DuraPrep and sterile drapes including sterile stockinette.  A time-  out was called to identify the correct patient and the correct right  knee.  I used an Esmarch to wrap out the leg and tourniquet was inflated  to 350 mm of pressure.  We then made a midline incision  directly over  the patella and carried this proximally and distally.  We dissected down  to the medial parapatellar arthrotomy and the effusion was encountered.  I then cleaned the knee and tibia of osteophytes using a rongeur and  osteotome.  We cleaned the soft tissue from the knee including the  medial and lateral meniscus and the ACL and PCL.  Then we proceeded with  computer navigation at this portion of the case.  Through two small  separate stab incisions in the tibia distal to her previous incision, we  placed  two Steinmann pins from an anteromedial to posterolateral  direction.  Through our main incision in the femur, we placed two  similar pins from an anteromedial to posterolateral direction in the  metaphyseal portion of the femur.  We then proceeded with the navigation  at this portion of the case and selected points throughout the tibia and  femur.  We selected rotation of the hip and then this allowed [place] for  correct sizing and soft tissue balancing of the knee.  First, we started  with making our tibial cut, we took 10 mm off the high side, again,  based off our plan with computer navigation.  Using the oscillating saw,  we were able to make a tibial cut and verified this under navigation.  I  felt that my cut was in adherence with our computer plan.  Next, we  checked this in flexion and extension and  felt that I had an adequate  space.  Next, we proceeded with our distal femoral cut and this was made  as well and balanced in flexion and extension.  We chose a size 3 for  our chamfer  cuts so the rotation off this was a slight external rotation  again in accordance with our computer plan.  We made the chamfer cuts  and then a box cut.  Next, a size 3 tibial tray was chosen and we made a  drill for the tibial keel.  I then placed a trial size 3 tibial  component followed by size 3 femoral component.  We trialed a 10-mm  polyethylene insert followed by 12.5-mm polyethylene insert.  I felt  that the 12.5 was more stable and again under navigation, we were  balancing completely in flexion and extension and our motion was felt  excellent and it was felt to be balanced in varus and valgus stressing  as well.  We drilled lugs for a size 32 patellar button and a patellar  button trial was placed and again I put towel clips with a size 12.5-mm  polyethylene insert up at 12 towel clips over the arthrotomy incision  into the knee through a range of motion again, it was felt to be stable.  All trial components were then removed and we thoroughly irrigated the  knee with pulsatile lavage using 3 L of normal saline solution.  I then  cemented the real size 3 rotating platform a tibial tray from DePuy  followed by the size 3 femur.  I then trialed a 12.5-mm polyethylene  insert.  Again, I felt this was stable so I placed the real 12.5-mm  insert.  Finally, we cemented the patellar button as well.  The knee  again showed to be balanced in flexion and extension under computer  navigation.  The Steinmann pins were then removed.  After the cement had  hardened with the tourniquet down and hemostasis was obtained with  electrocautery we then thoroughly irrigated the knee again and closed  the arthrotomy with interrupted #1 Vicryl suture followed by 2-0 Vicryl  in subcutaneous tissue and a running 4-0 Vicryl subcuticular stitch.  Steri-Strips were applied and the patient's knee was placed in a well-  padded sterile dressing.  She was awakened, extubated and taken to the  recovery room in  stable and good condition.  All final counts were  correct and there were no complications noted.      Vanita Panda. Magnus Ivan, M.D.  Electronically Signed     CYB/MEDQ  D:  01/21/2009  T:  01/21/2009  Job:  [id number]

## 2011-04-24 NOTE — Assessment & Plan Note (Signed)
[facility]                             PULMONARY OFFICE NOTE   JAYEL, SCADUTO                    MRN:          [id number]  DATE:07/22/2007                            DOB:          11/05/1936    PULMONARY FOLLOWUP NOTE   I saw Ms. Astarita today in followup for her dyspnea and pulmonary  nodules.  She states that her symptoms have been persistent as far as  feeling dyspneic with activity.   She had undergone pulmonary function test on August 11, which showed a  post-bronchodilator FEV1 to FVC ratio of 76%.  Her FEV1 was 2.28L, which  was 100% of predicted.  Her FVC was 2.99L, which was 100% of predicted.  There was the suggestion of a bronchodilator response based on her FEF25-  75.  Her total lung capacity was 5.14L, which was 102% of predicted.  Her diffusion capacity was 78% predicted, but corrected for lung volumes  was 111% of predicted.  Overall, these findings would be consistent with  essentially normal pulmonary function test with the possible suggestion  of asthma based on the change in the FEF25-75.   She does continue to also complain of arthritis and some symptoms of  weakness.   MEDICATION LIST:  Reviewed.   PHYSICAL EXAM:  She is 194 pounds, temperature 97, blood pressure  112/62, heart rate 69, oxygen saturation 99% on room air.  HEART:  S1, S2.  CHEST:  Clear to auscultation.  ABDOMEN:  Soft.  EXTREMITIES:  No edema.   IMPRESSION:  1. Dyspnea.  To date, her workup has been essentially unremarkable,      except for the possible suggestion of asthma on her spirometry,      although I am not completely convinced of this.  I will give her a      therapeutic trial of Proventil HFA 2 puffs q.i.d. p.r.n. to see if      she does get any benefit from this.  I have advised her to try to      use this prior to engaging in any exercise regimen to see if this      helps.  I also discussed with her that it would be ideal to be  able      to have her undergo a cardiopulmonary exercise test, but her      ability to perform these maneuvers would be somewhat limited based      on her arthritis.  I would also like for her to undergo further      laboratory tests to determine what type of arthritis she may be      having, and if this is contributing to some of her difficulties      with her breathing.  Ultimately I suspect her dyspnea will turn out      to be on the basis of obesity and deconditioning.   1. Pulmonary nodules.  She will need to have followup CT scan of her      chest in approximately 3 to 4 months.   I will follow up with her  in approximately 3 to 4 weeks.  I will call  her with the results of her blood tests.     Coralyn Helling, MD  Electronically Signed    VS/MedQ  DD: 07/22/2007  DT: 07/23/2007  Job #: [id number]   cc:   Nicki Guadalajara, M.D.

## 2011-04-24 NOTE — Op Note (Signed)
NAMEMarland Kitchen  Lisa Gardner, Lisa Gardner             ACCOUNT NO.:  [id number]   MEDICAL RECORD NO.:  [id number]          PATIENT TYPE:  INP   LOCATION:  0001                         FACILITY:  [facility]   PHYSICIAN:  Vanita Panda. Magnus Ivan, M.D.DATE OF BIRTH:  07/10/1936   DATE OF PROCEDURE:  08/04/2009  DATE OF DISCHARGE:                               OPERATIVE REPORT   PREOPERATIVE DIAGNOSIS:  Left knee severe osteoarthritis and  degenerative joint disease.   POSTOPERATIVE DIAGNOSIS:  Left knee severe osteoarthritis and  degenerative joint disease.   PROCEDURE:  Left total knee arthroplasty using computer navigation  guided assistance.   Implants:  DePuy Sigma rotating platform knee with size 3 femur, size 3  tibial tray, a 12.5-mm polyethylene insert, 32-mm patellar button.   SURGEON:  Vanita Panda. Magnus Ivan, MD   ASSISTANT:  Maud Deed, PAC.   ANESTHESIA:  General.   TOURNIQUET TIME:  69 minutes.   ESTIMATED BLOOD LOSS:  100-200 mL.   COMPLICATIONS:  None.   INDICATIONS:  Briefly, Lisa Gardner is 75 year old with severe  debilitating arthritis involving both her knees.  I performed a right  total knee arthroplasty in February and she did well.  She now presents  for left total knee arthroplasty.  Risks and benefits of surgery were  explained to her at length and she understood the need to proceed with  surgery as well as need to be on blood and medication.  She understood  the risk of DVT and PE.  She wished to proceed with surgery.   PROCEDURE:  After informed consent was obtained and the appropriate left  knee was marked, she was brought to the operating room and placed supine  on the operating table.  General anesthesia was then obtained.  A  nonsterile tourniquet was placed around her upper left thigh and the leg  was prepped and draped with DuraPrep and sterile drapes including  sterile stockinette.  A time-out was called to identify the correct  patient and the correct left  knee.  I then used an Esmarch to wrap out  the leg and the tourniquet inflated to 300 mm pressure.  A midline  incision was then made directly over the patella and carried proximally  and distally.  I was able to then perform a medial parapatellar  arthrotomy and dissect down to the joint.  You could see there was a  significant bone-on-bone wear of the medial compartment and grade 3 to  grade 4 changes of the lateral compartment.  I released the soft tissue  around the knee including the PCL, ACL as well as removing the meniscus.  We are able to dissect tissue from the medial side to release on the  medial side due to her varus deformity.  I next proceeded with computer  navigation portion of the case.  Through two small stab incisions in the  anterior medial tibia, I placed two Steinmann pins from an anteromedial  to a posterolateral direction.  Through my main incision up in the  metaphyseal area of the femur, I placed two Steinmann pins from  anteromedial to posterolateral.  This  allowed [place] to proceed with the  navigation portion of the case.  After picking points from the distal  tibia and fibula up to the knee and around the knee, we were able to  obtain the hip center of rotation as well and map out the knee from a  computer standpoint to be able to the obtain an appropriately aligned  and sized knee as possible.  We first then started with our tibial cut.  We took 10 mm off the high side and were able to make our cut and verify  this through our plan through navigation.  Next, we balanced with the  spacer blocks and selected our distal femoral cut at 10 mm.  This was  balanced in flexion and extension.  We next used again a size 3 off of  our plan for the femur and made the chamfer cuts off of this and finally  the box cut.  We then turned our attention next to the tibia and chose a  size 3 tibial tray.  We drilled for the stem component of the tibia as  well as the keel.  All  trial components were then placed and I trialed a  size 10 poly followed by 12.5 poly and the 12.5 poly was felt to be  stable and our knee was balanced in flexion and extension as well as  neutral alignment.  We next took around the 14 mm off the patella and  drilled for a 32 patellar button and placed the patella trial component  and  again put it through range of motion and it was stable.  I removed  all trial components and then fully irrigated the knee with pulsatile  lavage solution.  We then cemented the real tibial tray size 3 followed  by the real size 3 femur.  I placed the trial 12.5-mm polyethylene  insert and cemented the 32-mm patellar button.  We then removed the 12.5  trial and put the real 12.5 in and it was stable.  The Steinmann pins  were removed and we thoroughly irrigated the knee again once the cement  had dried.  I then  placed a medium Hemovac drain out the lateral aspect  of the knee.  We closed the deep arthrotomy with a #1 Vicryl suture  followed by 0 Vicryl in the deep tissue, 2-0 Vicryl in subcutaneous  tissue and interrupted staples on the skin.  Xeroform followed by a well-  padded sterile dressing was applied.  The tourniquet was let down at 69  minutes.  Hemostasis was obtained.  After the well-padded sterile  dressing was applied, the patient was awakened, extubated and taken to  the recovery room in stable condition.  Of note, Maud Deed, PA-C was  present and participated in the entirety of the case and her part was  crucial for completing the case.      Vanita Panda. Magnus Ivan, M.D.  Electronically Signed     CYB/MEDQ  D:  08/04/2009  T:  08/04/2009  Job:  [id number]

## 2011-04-24 NOTE — Discharge Summary (Signed)
NAMECARRINA, Lisa Gardner NO.:  [id number]   MEDICAL RECORD NO.:  [id number]          PATIENT TYPE:  INP   LOCATION:  5030                         FACILITY:  [facility]   PHYSICIAN:  Vanita Panda. Magnus Ivan, M.D.DATE OF BIRTH:  1936/02/09   DATE OF ADMISSION:  01/21/2009  DATE OF DISCHARGE:  02/02/2009                               DISCHARGE SUMMARY   ADMITTING DIAGNOSES:  Severe osteoarthritis and degenerative joint  disease, right knee.   DISCHARGE DIAGNOSES:  Severe osteoarthritis and degenerative joint  disease, right knee.   PROCEDURE:  Right total knee arthroplasty utilizing computer navigation  on January 21, 2009.   HOSPITAL COURSE:  Lisa Gardner is a 75 year old female with severe  debilitating arthritis involving both her knees with the right being  worse than the left.  I followed her for a quiet some time and tried  injections in her knees and other modalities to try to calm down her  symptoms.  This has gone to the point where it is having a profound  effect on her activities of daily living and she wishes to proceed with  a total knee replacement surgery.  The risks and benefits of this were  explained to her at length including the risk of DVT and fatal pulmonary  embolus.  It was felt that she could tolerate this surgery from a  medical standpoint and she did wish to proceed.  She was taken to the  operating room on the day of admission where she underwent a right total  knee arthroplasty utilizing computer navigation without complications.  For the detailed description of the operation, please refer to the  dictated operative note in the patient's medical record.  Postoperatively, she was admitted to a regular 4 bed and began working  with the CPM for range of motion of her knee as well as physical therapy  and occupational therapy for balance and coordination and increasing in  mobility.  Her hospital stay was prolonged secondary to a wound  breakdown.   She developed blistering around her knee incision from the  Steri-Strips and I needed to watch her knee closely during her  hospitalizations to make sure that she did not need any type of  irrigation and debridement.  By the day of discharge, her wound is  setting down and appeared to be healing nicely and it was felt that she  could be discharged safely to home.   DISPOSITION:  To home.   DISCHARGE MEDICATIONS:  Lantus insulin, metformin, Lasix, glimepiride,  gabapentin, Ocuvite, glucosamine, Zocor, Klor-Con, isosorbide, Cymbalta,  Coumadin, Percocet, Robaxin, aspirin, multivitamin, calcium, vitamin C,  vitamin B12, Tylenol as needed, and niacin.   DISCHARGE INSTRUCTIONS:  While she is at home, she will continue to work  with physical therapy in her home with weightbearing as tolerated and  range of motion of her knees.  Home health services will also be setup  for INR checks biweekly for adjustment of her Coumadin level.  A  followup will be established in my office in 2 weeks.     Vanita Panda. Magnus Ivan, M.D.  Electronically Signed  CYB/MEDQ  D:  03/24/2009  T:  03/25/2009  Job:  [id number]

## 2011-04-24 NOTE — Discharge Summary (Signed)
Lisa Gardner, Lisa Gardner             ACCOUNT NO.:  [id number]   MEDICAL RECORD NO.:  [id number]          PATIENT TYPE:  INP   LOCATION:  1430                         FACILITY:  [facility]   PHYSICIAN:  Lisa Gardner. Magnus Ivan, M.D.DATE OF BIRTH:  Mar 03, 1936   DATE OF ADMISSION:  08/04/2009  DATE OF DISCHARGE:  08/10/2009                               DISCHARGE SUMMARY   ADMISSION DIAGNOSIS:  Severe osteoarthritis, left knee.   DISCHARGE DIAGNOSIS:  Severe osteoarthritis, left knee.   SECONDARY DIAGNOSES:  1. Type 2 diabetes.  2. History of peripheral neuropathy.  3. High blood pressure.   PROCEDURES:  1. Left total knee arthroplasty on August 04, 2009.  2. CT angiogram to rule out pulmonary embolus.  3. Echocardiogram.   HOSPITAL COURSE:  Lisa Gardner is a 75 year old who in February of this  year had previously undergone a right total knee arthroplasty.  She  presented with severe osteoarthritis now involving her left knee.  She  wished to proceed with total knee arthroplasty of her left side.  Risks  and benefits of surgery were explained and well understood and she  understood the reasoning why proceeding for surgery.  She was taken to  the operating room on the day of admission, where she underwent a left  total knee arthroplasty utilizing computer navigation.  This was  performed without complications.  For detailed description of the  operation please refer the patient's operative report in the medical  record.  Postoperatively she is admitted to an orthopedic floor bed and  did have an episode of acute blood loss anemia.  Transfusion was given.  She did develop chest pain and a CT angiogram was ordered to rule out  pulmonary embolus and cardiac studies were obtained, as well as a  Cardiology consult.  An echocardiogram was obtained as well and it was  felt that there was no acute cardiac event.  She was transferred to a  telemetry floor for further convalescence and the  remainder of her  hospital stay was uncomplicated.  She was working with physical therapy.  It was recommended about the possibility of skilled nursing facility,  but her and her husband take care of a dependent child at home and she  preferred to be discharged to home.  By the day of discharge she was  afebrile with stable vital signs.  She had no chest pain or shortness of  breath and her labs had all normalized.  It was felt she could be  discharged safely to home.   DISPOSITION:  To home.   DISCHARGE INSTRUCTIONS:  Once she is at home, advanced home health care  will come in to work with range of motion of her knee and gait training.  She can have a CPM for home and lab draws for adjustment of her  Coumadin.  Followup in the office will be established in 2 weeks.  She  can get her incision wet as well.   DISCHARGE MEDICATIONS:  1. See medical reconciliation orders for starting all of her home      medicines back.  2. Percocet.  3.  Robaxin.  4. Coumadin.      Lisa Gardner. Magnus Ivan, M.D.  Electronically Signed     CYB/MEDQ  D:  08/10/2009  T:  08/10/2009  Job:  [id number]

## 2011-04-27 NOTE — H&P (Signed)
[name], [name]             ACCOUNT NO.:  [id number]   MEDICAL RECORD NO.:  [id number]          PATIENT TYPE:  OBV   LOCATION:  0108                         [facility]ACILITY:  [facility]   PHYSICIAN:  Lisa Gardner, M.D. DATE OF BIRTH:  1936/05/17   DATE OF ADMISSION:  05/19/2006  DATE OF DISCHARGE:                                HISTORY & PHYSICAL   PRIMARY CARE PHYSICIAN:  Lisa Gardner, M.D.   CHIEF COMPLAINT:  Dizziness.   HISTORY OF PRESENT ILLNESS:  This is a 75 year old female with a history of  diabetes, who has complained of having severe dizziness for over 24 hours.  She reports this dizziness comes and goes and occurs without association to  position changes.  Her symptoms are so severe that she almost falls and is  unsteady on her feet.  She denies having any associated symptoms of nausea,  vomiting, shortness of breath, chest pain, or any other focal deficits.  She  denies having any similar symptoms previously.  The patient does report that  her blood sugars have not been extremely high or low.  She denies having any  fever, chills, or any upper respiratory symptoms.   REVIEW OF SYSTEMS:  Pertinent, as mentioned above.   PAST MEDICAL HISTORY:  Type 2 diabetes, hyperlipidemia, hypertension,  diabetic neuropathy, menopause.   MEDICATIONS:  1.  Metformin 1000 mg 1 p.o. b.i.d.  2.  Metformin 500 mg 1 p.o. q.a.m.  3.  Amaryl 4 mg 1 p.o. b.i.d.  4.  Actos 45 mg 1 p.o. q.a.m.  5.  Furosemide 80 mg 1 p.o. q.a.m.  6.  Lipitor 40 mg 1 p.o. daily.  7.  Prandin 2 mg t.i.d. with meals.  8.  Multivitamin 1 p.o. daily.  9.  Altace 2 mg 1 p.o. daily.   ALLERGIES:  No known drug allergies.   SOCIAL HISTORY:  Patient has been married for 50 years.  No history of  alcohol or tobacco usage.   FAMILY HISTORY:  Not obtained.   PHYSICAL EXAMINATION:  VITAL SIGNS:  Temperature 98.1, blood pressure  121/72, heart rate 69, respirations 16, O2 saturations 99% on room air.  GENERAL:  This is a pleasant 75 year old female, well-developed and well-  nourished in no acute distress.  HEENT:  Normocephalic and atraumatic.  No scleral icterus.  Pupils are  equal, round and reactive to light.  Extraocular muscles are intact.  Funduscopic examination:  No cataracts.  No exudates.  No hemorrhages.  No  AV nicking.  Tympanic membranes dull bilaterally, worse on the right.  Oropharynx clear of oropharyngeal exudates.  Extensive dental work present.  NECK:  Supple.  Full range of motion.  No jugular venous distention.  No  carotid bruits.  No thyromegaly.  No adenopathy.  CHEST WALL:  No unusual masses.  No breast masses.  LUNGS:  Clear to auscultation bilaterally.  CARDIOVASCULAR:  Regular rate and rhythm.  No murmurs, rubs or gallops.  ABDOMEN:  Positive bowel sounds.  Soft, nontender.  Nondistended.  EXTREMITIES:  Bilateral lower extremity neuropathic pain.  No edema.  NEUROLOGIC:  Patient is alert and  oriented x3.  Cranial nerves are intact.  Motor and sensory function intact except for the neuropathic changes in the  anterior aspects of the bilateral lower extremities.  There is a positive  Rhomberg's sign.  Deep tendon reflexes 2/4, symmetric throughout.  MUSCULOSKELETAL:  Strength is 5/5.  Full range of motion.  No atrophy.   LABORATORY STUDIES:  White blood cell count 7.3, hemoglobin 11.9, hematocrit  35.8, MCV 85.4, platelets 217, neutrophils 53%.  Chemistry:  Sodium 140,  potassium 3.2, chloride 105, bicarb 28, BUN 15, creatinine 0.9.  Glucose is  49.  Calcium 8.7.  Cardiac enzymes:  Myoglobin level 54.9, CK-MB 1.2,  troponin less than 0.05.  Negative first cardiac markers.   EKG revealing a normal sinus rhythm.  No acute ST segment changes.   ASSESSMENT:  A 75 year old female with seizure dizziness/vertigo and a  positive Rhomberg's sign.  Her acute admission problems include:  Problem 1:  Dizziness/vertigo.  Problem 2:  Hypoglycemia.  Problem 3:  Mild  hypokalemia.  Problem 4:  Type 2 diabetes mellitus.  Problem 5:  History of hyperlipidemia.  Problem 6:  History of hypertension.  Problem 7:  Diabetic neuropathy.   PLAN:  Patient has been admitted for 24-hour observation.  Cardiac enzymes  will be continued q.8h. x3.  An MRI and MRA of the brain and a carotid  ultrasound have been ordered to evaluate for vessel disease and a possible  etiology of the dizziness.  Patient will continue on her regular medications  and will be placed on the hypoglycemic protocol p.r.n.  She will also be  placed on p.r.n. meclizine therapy for her dizziness.  Supplemental  potassium therapy has also been ordered.      Lisa Gardner, M.D.  Electronically Signed     HJ/MEDQ  D:  05/19/2006  T:  05/19/2006  Job:  [id number]

## 2011-04-27 NOTE — Discharge Summary (Signed)
Lisa Gardner, Lisa Gardner             ACCOUNT NO.:  [id number]   MEDICAL RECORD NO.:  [id number]          PATIENT TYPE:  INP   LOCATION:  1432                         FACILITY:  [facility]   PHYSICIAN:  Mobolaji B. Bakare, M.D.DATE OF BIRTH:  07-Sep-1936   DATE OF ADMISSION:  05/19/2006  DATE OF DISCHARGE:  05/22/2006                                 DISCHARGE SUMMARY   PRIMARY CARE PHYSICIAN:  Olene Craven, M.D.   FINAL DIAGNOSES:  1.  Small acute left posterior frontal cerebrovascular accident.  2.  Dizziness, thought to be secondary to labyrinthitis versus      cerebrovascular accident.   SECONDARY DIAGNOSES:  1.  Diabetes mellitus.  2.  Dyslipidemia.  3.  Hypertension.  4.  Diabetic neuropathy.  5.  Left shoulder pain secondary to degenerative changes.   PROCEDURES:  1.  Head CT scan showed no evidence of acute intracranial abnormality.  2.  MRI of the brain showed small sub-centimeter acute white matter      infarction in the posterior frontal region on the left, atrial      fibrillation with chronic microvascular ischemic changes, most likely      secondary to patient's diabetes.  3.  MRA showed minimal non-stenotic atherosclerotic changes in the bilateral      carotid siphons.  4.  Left shoulder x-ray showed mild degenerative changes of the AC joint.      No acute findings.  5.  A 2-D echocardiogram done on June 12 showed normal left ventricular      ejection fraction with EF of 60%.  There was no significant valvular      disease.  No obvious cardiac source of embolus, and no evidence of PFO.  6.  Carotid Doppler's showed no significant carotid artery stenosis.  7.  Speech evaluation showed no evidence of dysphagia.   BRIEF HISTORY:  Please refer to the admission H&P.  In brief, Lisa Gardner  is a 75 year old Caucasian female who was admitted following several spells  of dizziness.  She actually described this dizziness as spinning within her  head.  There was no  associated tinnitus or hearing loss.  There was no  preceding upper respiratory tract infection.  This dizziness was not  positional in nature.  He denied any chest pain.  Initial vitals were  normal.  Physical examination was unremarkable except for positive Romberg's  sign.  She had full strength in all limbs.  Cranial nerves were all intact.  She was noted to have hypoglycemia on initial blood evaluation.  Blood  glucose was 49.  The patient did describe episodes of low blood sugar,  particularly waking up in the morning prior to breakfast blood sugars in the  range of 62-80, but she has never been symptomatic.   HOSPITAL COURSE:  #1 - SMALL ACUTE FRONTAL CEREBROVASCULAR ACCIDENT.  The  patient underwent workup to include MRI of the brain in order to define  etiology of dizziness.  MRI of the brain did show scar, acute left frontal  infarct which is sub-centimeter with chronic microvascular ischemic changes.  Careful stroke workup which  was unrevealing, homocysteine level was normal.  Carotid Doppler, 2D echocardiogram were normal.  She was telemetry.  There  was no abnormal arrhythmia.  These were thought to be secondary to  underlying chronic medical conditions including diabetes mellitus,  hypertension and dyslipidemia.  She did not have any neurological sequelae  from this.  This sub-centimeter infarct does not sufficiently explain the  degree of dizziness/vertigo that she experienced.  MRA of the neck showed  patent dominant vertebral arteries, but basilar carotid arteries were  widely patent.   She had a couple of vertiginous experience while in the hospital which  lasted less than one minute.  She was able to do well with physical therapy  prior to discharge.  Dizziness as admitted.  She was placed on Meclizine.   #2 - HYPOGLYCEMIA.  She had another episode of asymptomatic hypoglycemia  approximately 6:35 a.m. on May 22, 2006.  We repeated a hemoglobin A1C  which was 7.1.   The patient stated that her last hemoglobin A1C about three  weeks prior to her hospitalization was 7.4 and her diabetic medication had  been adjusted.  I have instructed her to cut back on Metformin to 1000 mg  b.i.d., to increase nighttime slot since these episodes of asymptomatic  hypoglycemia occur prior to breakfast.  She will be following up with Dr.  Barbee Shropshire for continuation of management of her diabetes.   #3 - All other medical conditions remained stable over the course of  hospitalization.  Fasting lipid profile was normal.   LABORATORY DATA:  Cardiac enzymes were negative.  TSH was 3.007, hemoglobin  A1C 7.1, total cholesterol 140, HDL 33, LDL 79, homocysteine level 13.4.  B-  met:  Sodium 142, potassium 3.7, BUN 16, creatinine 1.0.   DISCHARGE MEDICATIONS:  1.  Meclizine 25 mg p.o. b.i.d. x1 week.  2.  Metformin 1000 mg p.o. b.i.d.  3.  Amaryl 4 mg p.o. b.i.d.  4.  Actos 45 mg p.o. daily.  5.  Lasix 80 mg p.o. daily.  6.  Lipitor 40 mg p.o. daily.  7.  Clonidine 2 mg p.o. t.i.d.  8.  Multivitamin one tablet daily.  9.  Altace 2 mg p.o. daily.  10. Ocuvite potassium supplement.      Mobolaji B. Corky Downs, M.D.  Electronically Signed     MBB/MEDQ  D:  05/31/2006  T:  05/31/2006  Job:  [id number]   cc:   Olene Craven, M.D.  Fax: [phone]

## 2011-07-06 ENCOUNTER — Other Ambulatory Visit (HOSPITAL_COMMUNITY): Payer: Self-pay | Admitting: Internal Medicine

## 2011-07-06 DIAGNOSIS — Z1231 Encounter for screening mammogram for malignant neoplasm of breast: Secondary | ICD-10-CM

## 2011-07-09 ENCOUNTER — Ambulatory Visit (HOSPITAL_COMMUNITY)
Admission: RE | Admit: 2011-07-09 | Discharge: 2011-07-09 | Disposition: A | Discharge status: Home / Self Care | Payer: PRIVATE HEALTH INSURANCE | Source: Ambulatory Visit | Attending: Internal Medicine | Admitting: Internal Medicine

## 2011-07-09 DIAGNOSIS — Z1231 Encounter for screening mammogram for malignant neoplasm of breast: Secondary | ICD-10-CM | POA: Insufficient documentation

## 2011-09-21 LAB — BASIC METABOLIC PANEL
CO2: 30
Chloride: 107
GFR calc Af Amer: >60
Potassium: 3.7
Sodium: 142

## 2011-09-21 LAB — POCT HEMOGLOBIN-HEMACUE: Hemoglobin: 10.9 — ABNORMAL LOW

## 2012-07-29 ENCOUNTER — Other Ambulatory Visit (HOSPITAL_COMMUNITY): Payer: Self-pay | Admitting: Internal Medicine

## 2012-07-29 DIAGNOSIS — Z1231 Encounter for screening mammogram for malignant neoplasm of breast: Secondary | ICD-10-CM

## 2012-08-13 ENCOUNTER — Ambulatory Visit (HOSPITAL_COMMUNITY)
Admission: RE | Admit: 2012-08-13 | Discharge: 2012-08-13 | Disposition: A | Discharge status: Home / Self Care | Payer: Medicare Other | Source: Ambulatory Visit | Attending: Internal Medicine | Admitting: Internal Medicine

## 2012-08-13 DIAGNOSIS — Z1231 Encounter for screening mammogram for malignant neoplasm of breast: Secondary | ICD-10-CM | POA: Insufficient documentation

## 2012-10-31 ENCOUNTER — Other Ambulatory Visit (HOSPITAL_COMMUNITY): Payer: Self-pay | Admitting: Internal Medicine

## 2012-11-04 ENCOUNTER — Encounter (HOSPITAL_COMMUNITY): Payer: Self-pay

## 2012-11-04 ENCOUNTER — Encounter (HOSPITAL_COMMUNITY)
Admission: RE | Admit: 2012-11-04 | Discharge: 2012-11-04 | Disposition: A | Discharge status: Home / Self Care | Payer: Medicare Other | Source: Ambulatory Visit | Attending: Internal Medicine | Admitting: Internal Medicine

## 2012-11-04 DIAGNOSIS — M810 Age-related osteoporosis without current pathological fracture: Secondary | ICD-10-CM | POA: Insufficient documentation

## 2012-11-04 HISTORY — DX: Transient cerebral ischemic attack, unspecified: G45.9

## 2012-11-04 HISTORY — DX: Myoneural disorder, unspecified: G70.9

## 2012-11-04 HISTORY — DX: Atherosclerotic heart disease of native coronary artery without angina pectoris: I25.10

## 2012-11-04 HISTORY — DX: Type 2 diabetes mellitus with diabetic neuropathy, unspecified: E11.40

## 2012-11-04 HISTORY — DX: Type 2 diabetes mellitus without complications: E11.9

## 2012-11-04 HISTORY — DX: Gastro-esophageal reflux disease without esophagitis: K21.9

## 2012-11-04 HISTORY — DX: Chronic obstructive pulmonary disease, unspecified: J44.9

## 2012-11-04 MED ORDER — ZOLEDRONIC ACID 5 MG/100ML IV SOLN
5.0000 mg | Freq: Once | INTRAVENOUS | Status: AC
  Administered 2012-11-04: 5 mg via INTRAVENOUS
  Filled 2012-11-04: qty 100

## 2012-11-04 MED ORDER — SODIUM CHLORIDE 0.9 % IV SOLN
INTRAVENOUS | Status: DC
  Administered 2012-11-04: 14:00:00 via INTRAVENOUS

## 2013-07-24 ENCOUNTER — Other Ambulatory Visit (HOSPITAL_COMMUNITY): Payer: Self-pay | Admitting: Internal Medicine

## 2013-07-24 DIAGNOSIS — Z1231 Encounter for screening mammogram for malignant neoplasm of breast: Secondary | ICD-10-CM

## 2013-08-14 ENCOUNTER — Ambulatory Visit (HOSPITAL_COMMUNITY)
Admission: RE | Admit: 2013-08-14 | Discharge: 2013-08-14 | Disposition: A | Discharge status: Home / Self Care | Payer: Medicare Other | Source: Ambulatory Visit | Attending: Internal Medicine | Admitting: Internal Medicine

## 2013-08-14 DIAGNOSIS — Z1231 Encounter for screening mammogram for malignant neoplasm of breast: Secondary | ICD-10-CM

## 2013-11-06 ENCOUNTER — Other Ambulatory Visit (HOSPITAL_COMMUNITY): Payer: Self-pay | Admitting: Internal Medicine

## 2013-11-11 ENCOUNTER — Ambulatory Visit (HOSPITAL_COMMUNITY)
Admission: RE | Admit: 2013-11-11 | Discharge: 2013-11-11 | Disposition: A | Discharge status: Home / Self Care | Payer: Medicare Other | Source: Ambulatory Visit | Attending: Internal Medicine | Admitting: Internal Medicine

## 2013-11-11 ENCOUNTER — Encounter (HOSPITAL_COMMUNITY): Payer: Self-pay

## 2013-11-11 DIAGNOSIS — M810 Age-related osteoporosis without current pathological fracture: Secondary | ICD-10-CM | POA: Insufficient documentation

## 2013-11-11 MED ORDER — ZOLEDRONIC ACID 5 MG/100ML IV SOLN
5.0000 mg | Freq: Once | INTRAVENOUS | Status: AC
  Administered 2013-11-11: 5 mg via INTRAVENOUS
  Filled 2013-11-11: qty 100

## 2013-11-11 MED ORDER — SODIUM CHLORIDE 0.9 % IV SOLN
INTRAVENOUS | Status: AC
  Administered 2013-11-11: 10:00:00 via INTRAVENOUS

## 2013-12-02 ENCOUNTER — Telehealth: Payer: Self-pay

## 2013-12-02 NOTE — Telephone Encounter (Signed)
I was speaking to another individual in patient's home. Patient asked that I let her primary physician know that she needs her Fernofibrate refilled, if you still want her on that. Otherwise, she would like someone to let her know, if you do not want her on it.

## 2013-12-07 ENCOUNTER — Encounter: Payer: Self-pay | Admitting: Cardiology

## 2013-12-07 ENCOUNTER — Telehealth: Payer: Self-pay | Admitting: Cardiovascular Disease

## 2013-12-07 ENCOUNTER — Ambulatory Visit (INDEPENDENT_AMBULATORY_CARE_PROVIDER_SITE_OTHER): Payer: Medicare Other | Admitting: Cardiology

## 2013-12-07 VITALS — BP 110/64 | HR 68 | Ht 64.5 in | Wt 172.2 lb

## 2013-12-07 DIAGNOSIS — E785 Hyperlipidemia, unspecified: Secondary | ICD-10-CM

## 2013-12-07 DIAGNOSIS — I2510 Atherosclerotic heart disease of native coronary artery without angina pectoris: Secondary | ICD-10-CM

## 2013-12-07 DIAGNOSIS — E119 Type 2 diabetes mellitus without complications: Secondary | ICD-10-CM

## 2013-12-07 DIAGNOSIS — E1165 Type 2 diabetes mellitus with hyperglycemia: Secondary | ICD-10-CM | POA: Insufficient documentation

## 2013-12-07 DIAGNOSIS — R079 Chest pain, unspecified: Secondary | ICD-10-CM

## 2013-12-07 MED ORDER — ISOSORBIDE DINITRATE 30 MG PO TABS: 30.0000 mg | ORAL_TABLET | Freq: Two times a day (BID) | ORAL | Status: DC

## 2013-12-07 MED ORDER — CYCLOBENZAPRINE HCL 5 MG PO TABS: 5.0000 mg | ORAL_TABLET | Freq: Three times a day (TID) | ORAL | Status: DC | PRN

## 2013-12-07 NOTE — Telephone Encounter (Signed)
Mr Jakubek calling and says his wife has been having pain in left breast into back and left arm.  Went to family dr last Tuesday and they recommended to call heart dr.  Please call

## 2013-12-07 NOTE — Telephone Encounter (Signed)
Returned call and pt verified x 2 w/ pt's husband, Lisa Gardner.  Stated pt c/o pain in L breast, L back and L arm x 2 weeks.  Stated he cannot get her to go to the doctor, hospital or anything.  Stated he took her to her family doctor last Tuesday and he told her if she didn't get any better then she needed to go to her heart doctor.  Unsure if pt has NTG or if pt has taken it.  Husband asking pt if she has NTG.  Pt on phone.  Pt has same complaints and described pain as pressure that is constant.  Stated she feels better.  Stated she has NTG, but doesn't take any medicine w/o the doctor telling her to.  Pt informed prescription medication gives her permission to take it b/c the doctor is not with her and would not know when she needs to take the medication.  Pt informed NTG is for chest pain/pressure and should be taken if needed.  Pt verbalized understanding and agreed w/ plan.  Dr. Tresa Endo notified and advised pt come in to see Ext[facility] today and he will see if needed.  Pt informed and verbalized understanding.  Appt scheduled for today at 11:40am w/ Nada Boozer, NP.  Pt advised ER for any worsening symptoms before appt.  Pt verbalized understanding and agreed w/ plan.

## 2013-12-07 NOTE — Assessment & Plan Note (Signed)
followed by PCP 

## 2013-12-07 NOTE — Assessment & Plan Note (Signed)
No acute EKG changes, the chest pain is atypical, lasting all day.  Also tender to touch across chest.  She has known CAD, HTN, DM, hyperlipidemia and family hx. Of CAD.  Will do lexiscan myoview this week as pt could not walk on treadmill with the chest pain and her chronic back pain.  If positive would recommend cath.  I have increased her isosorbide and added flexeril to take prn.

## 2013-12-07 NOTE — Assessment & Plan Note (Signed)
Per PCP, new insulin recently added, she does not know name.

## 2013-12-07 NOTE — Assessment & Plan Note (Signed)
Last cath 2008 and last nuc negative 2013.

## 2013-12-07 NOTE — Patient Instructions (Addendum)
I increased your isosorbide to twice a day.  I added flexaril 5 mg up to 3 X a day for pain, do not take and drive.  Have the lexiscan stress test this week.  You will follow up with Dr. Tresa Endo after the stress test.Your physician has requested that you have a lexiscan myoview. For further information please visit [email or web address] Please follow instruction sheet, as given.     If the pain is more severe or wakes you from sleep go to the emergency room.

## 2013-12-07 NOTE — Progress Notes (Signed)
12/07/2013   PCP: Gaspar Garbe, MD   Chief Complaint  Patient presents with  . Chest Pain    Constant left pain pressure than radiates to left arm & straight into back with weakness & dizziness.. Denies SOB.  . Diabetes    Pt reports she has started on a short acting insulin pen in which she's administers 6 u sub q in AM, 12 usub q at lunch & 6 units sub q @ dinner    Primary Cardiologist: Dr. Tresa Endo  HPI:  77 year old female with hx of CAD with cath 2008 revealing 30% Lt main, 40% diag stenosis and 50% mid RCA disease, with hyperlipidemia, HTN, and diabetes and family hx CAD.  She also has torn lt rotator cuff and has had several back surgeries and needs more per the pt.    Her last stress test was in 2013 and it was negative for ischemia and EF 57%. Echo in 2010 with EF 60-65%.    Pt is seen today with 2 week history of chest pain Lt chest under her lt breast with radiation to back and some pain in her lt arm.  She has had occ episode of nausea with this.  No SOB.  None with rest but it does increase with activity.  It begins when she wakes and lasts all day until she goes to bed.  She does have a left torn rotator cuff but she tells me this current pain is in a different location and is much worse.  A bad ache.  She has not taken any medication for the pain.  Allergies  Allergen Reactions  . Garlic Diarrhea  . Kiwi Extract     Tongue breaks out in blisters  . Vitamin E Rash    Current Outpatient Prescriptions  Medication Sig Dispense Refill  . Ascorbic Acid (VITAMIN C) 1000 MG tablet Take 1,000 mg by mouth daily.      Marland Kitchen aspirin 325 MG tablet Take 325 mg by mouth daily.      Marland Kitchen atorvastatin (LIPITOR) 80 MG tablet Take 80 mg by mouth daily.      . calcium-vitamin D (OSCAL WITH D) 250-125 MG-UNIT per tablet Take 2 tablets by mouth daily.      Marland Kitchen CINNAMON PO Take by mouth daily.      . DULoxetine (CYMBALTA) 60 MG capsule Take 60 mg by mouth daily.      Marland Kitchen  ezetimibe (ZETIA) 10 MG tablet Take 10 mg by mouth daily.      . ferrous sulfate 325 (65 FE) MG tablet Take 325 mg by mouth daily with breakfast.      . furosemide (LASIX) 80 MG tablet Take 80 mg by mouth See admin instructions. Take 1 tablet by mouth alternating with 2 tablets every other day      . gabapentin (NEURONTIN) 300 MG capsule Take 300 mg by mouth 3 (three) times daily.      Marland Kitchen glimepiride (AMARYL) 4 MG tablet Take 4 mg by mouth 2 times daily at 12 noon and 4 pm.      . Glucosamine-Chondroit-Vit C-Mn (GLUCOSAMINE 1500 COMPLEX PO) Take by mouth.      Marland Kitchen glucose blood test strip 1 each by Other route as needed. Use as instructed      . insulin aspart (NOVOLOG) 100 UNIT/ML injection Inject 38 Units into the skin daily.       . isosorbide dinitrate (ISORDIL) 30 MG  tablet Take 1 tablet (30 mg total) by mouth 2 (two) times daily. Take 1 tablet by mouth once daily in the morning  60 tablet  6  . metFORMIN (GLUCOPHAGE) 1000 MG tablet Take 1,000 mg by mouth 2 (two) times daily with a meal.      . Multiple Vitamins-Minerals (MULTIVITAMIN WITH MINERALS) tablet Take 1 tablet by mouth daily.      . multivitamin-lutein (OCUVITE-LUTEIN) CAPS Take 1 capsule by mouth 2 (two) times daily.       . nabumetone (RELAFEN) 750 MG tablet Take 750 mg by mouth as needed. Take 1 tablet by mouth once daily if needed      . potassium chloride (KLOR-CON) 20 MEQ packet Take 20 mEq by mouth 2 (two) times daily.      . cyclobenzaprine (FLEXERIL) 5 MG tablet Take 1 tablet (5 mg total) by mouth 3 (three) times daily as needed for muscle spasms.  30 tablet  0   No current facility-administered medications for this visit.    Past Medical History  Diagnosis Date  . TIA (transient ischemic attack) 2009 and 2011  . COPD (chronic obstructive pulmonary disease)   . Diabetes mellitus without complication     insulin dependant  . GERD (gastroesophageal reflux disease)   . Diabetic neuropathy   . Neuromuscular disorder      Lumber Four-Five  . Coronary artery disease 2008    BY CATH, NON OBSTRUCTIVE, last nuc 2013 no ischemia EF 57%  . Hyperlipidemia LDL goal < 70     Past Surgical History  Procedure Laterality Date  . Back surgery  1998, 1975    Lumbar  . Joint replacement  Feb. 2011/August 2011    Bilateral Knees   . Abdominal hysterectomy  1975    Partial, still has ovaries.  . Tonsillectomy  1949  . Cardiac catheterization  2008    30% LM, 40% diag, 50% RCA    ZOX:WRUEAVW:UJ colds or fevers, no weight changes Skin:no rashes or ulcers HEENT:no blurred vision, no congestion CV:see HPI PUL:see HPI GI:no diarrhea constipation or melena, no indigestion GU:no hematuria, no dysuria MS:+ joint pain lt shoulder at site, torn rotator cuff, no claudication Neuro:no syncope, no lightheadedness Endo:+  Diabetes HgBA1c at 8, no thyroid disease  PHYSICAL EXAM BP 110/64  Pulse 68  Ht 5' 4.5" (1.638 m)  Wt 172 lb 3.2 oz (78.109 kg)  BMI 29.11 kg/m2 General:Pleasant affect, NAD Skin:Warm and dry, brisk capillary refill HEENT:normocephalic, sclera clear, mucus membranes moist Neck:supple, no JVD, no bruits  Heart:S1S2 RRR without murmur, gallup, rub or click, + tenderness to touch all over chest Lungs:clear without rales, rhonchi, or wheezes WJX:BJYN, non tender, + BS, do not palpate liver spleen or masses Ext:no lower ext edema, 2+ pedal pulses, 2+ radial pulses, again tenderness to touch all over Neuro:alert and oriented, MAE, follows commands, + facial symmetry  EKG: SR no acute EKG changes  ASSESSMENT AND PLAN Chest pain No acute EKG changes, the chest pain is atypical, lasting all day.  Also tender to touch across chest.  She has known CAD, HTN, DM, hyperlipidemia and family hx. Of CAD.  Will do lexiscan myoview this week as pt could not walk on treadmill with the chest pain and her chronic back pain.  If positive would recommend cath.  I have increased her isosorbide and added flexeril to take  prn.  CAD (coronary artery disease) Last cath 2008 and last nuc negative 2013.  Diabetes Per PCP, new  insulin recently added, she does not know name.  Hyperlipidemia LDL goal < 70 followed by PCP.  I did ask her to go to ER if pain increases in severity or awakens her from sleep. She will follow up with Dr. Tresa Endo with results.  I did review EKG and plan with Dr. Tresa Endo.

## 2013-12-09 ENCOUNTER — Ambulatory Visit (HOSPITAL_COMMUNITY)
Admission: RE | Admit: 2013-12-09 | Discharge: 2013-12-09 | Disposition: A | Discharge status: Home / Self Care | Payer: Medicare Other | Source: Ambulatory Visit | Attending: Internal Medicine | Admitting: Internal Medicine

## 2013-12-09 DIAGNOSIS — E119 Type 2 diabetes mellitus without complications: Secondary | ICD-10-CM | POA: Insufficient documentation

## 2013-12-09 DIAGNOSIS — R079 Chest pain, unspecified: Secondary | ICD-10-CM | POA: Insufficient documentation

## 2013-12-09 DIAGNOSIS — I2510 Atherosclerotic heart disease of native coronary artery without angina pectoris: Secondary | ICD-10-CM | POA: Insufficient documentation

## 2013-12-09 DIAGNOSIS — E785 Hyperlipidemia, unspecified: Secondary | ICD-10-CM | POA: Insufficient documentation

## 2013-12-09 MED ORDER — TECHNETIUM TC 99M SESTAMIBI GENERIC - CARDIOLITE
9.5000 | Freq: Once | INTRAVENOUS | Status: AC | PRN
  Administered 2013-12-09: 10 via INTRAVENOUS

## 2013-12-09 MED ORDER — AMINOPHYLLINE 25 MG/ML IV SOLN
75.0000 mg | Freq: Once | INTRAVENOUS | Status: AC
  Administered 2013-12-09: 75 mg via INTRAVENOUS

## 2013-12-09 MED ORDER — TECHNETIUM TC 99M SESTAMIBI GENERIC - CARDIOLITE
28.9000 | Freq: Once | INTRAVENOUS | Status: AC | PRN
  Administered 2013-12-09: 28.9 via INTRAVENOUS

## 2013-12-09 MED ORDER — REGADENOSON 0.4 MG/5ML IV SOLN
0.4000 mg | Freq: Once | INTRAVENOUS | Status: AC
  Administered 2013-12-09: 0.4 mg via INTRAVENOUS

## 2013-12-09 NOTE — Procedures (Addendum)
[facility] [address] [place] [address] [phone]  Cardiology Nuclear Med Study  Lisa Gardner is a 77 y.o. female     MRN : [id number]     DOB: 1936-05-15  Procedure Date: 12/09/2013  Nuclear Med Background Indication for Stress Test:  Evaluation for Ischemia History:  COPD and CAD Cardiac Risk Factors: Family History - CAD, Hypertension, IDDM Type 2, Lipids, Overweight and TIA  Symptoms:  Chest Pain, Dizziness and Fatigue   Nuclear Pre-Procedure Caffeine/Decaff Intake:  7:00pm NPO After: 5:00am   IV Site: R Forearm  IV 0.9% NS with Angio Cath:  22g  Chest Size (in):  n/a IV Started by: Emmit Pomfret, RN  Height: 5\' 5"  (1.651 m)  Cup Size: C  BMI:  Body mass index is 28.62 kg/(m^2). Weight:  172 lb (78.019 kg)   Tech Comments:  n/a    Nuclear Med Study 1 or 2 day study: 1 day  Stress Test Type:  Lexiscan  Order Authorizing Provider:  Nicki Guadalajara, MD   Resting Radionuclide: Technetium 61m Sestamibi  Resting Radionuclide Dose: 9.5 mCi   Stress Radionuclide:  Technetium 33m Sestamibi  Stress Radionuclide Dose: 28.9 mCi           Stress Protocol Rest HR: 63 Stress HR: 78  Rest BP:118/75 Stress BP: 132/73  Exercise Time (min): n/a METS: n/a          Dose of Adenosine (mg):  n/a Dose of Lexiscan: 0.4 mg  Dose of Atropine (mg): n/a Dose of Dobutamine: n/a mcg/kg/min (at max HR)  Stress Test Technologist: Ernestene Mention, CCT Nuclear Technologist: Gonzella Lex, CNMT   Rest Procedure:  Myocardial perfusion imaging was performed at rest 45 minutes following the intravenous administration of Technetium 30m Sestamibi. Stress Procedure:  The patient received IV Lexiscan 0.4 mg over 15-seconds.  Technetium 39m Sestamibi injected at 30-seconds.  Due to patient's nausea, dizziness and stomach pain, she was given IV Aminophylline 75 mg. Symptoms were resolved during recovery. There were no significant changes  with Lexiscan.  Quantitative spect images were obtained after a 45 minute delay.  Transient Ischemic Dilatation (Normal <1.22):  1.03 Lung/Heart Ratio (Normal <0.45):  0.31 QGS EDV:  70 ml QGS ESV:  27 ml LV Ejection Fraction: 62%  Rest ECG: NSR - Normal EKG  Stress ECG: There are scattered PVCs.  QPS Raw Data Images:  Normal; no motion artifact; normal heart/lung ratio. Stress Images:  Normal homogeneous uptake in all areas of the myocardium. Rest Images:  Normal homogeneous uptake in all areas of the myocardium. Subtraction (SDS):  No evidence of ischemia.  Impression Exercise Capacity:  Lexiscan with no exercise. BP Response:  Normal blood pressure response. Clinical Symptoms:  No significant symptoms noted. ECG Impression:  No significant ST segment change suggestive of ischemia. Comparison with Prior Nuclear Study: No significant change from previous study  Overall Impression:  Normal stress nuclear study.  LV Wall Motion:  NL LV Function; NL Wall Motion; EF 62%.  Chrystie Nose, MD, [facility] Board Certified in Nuclear Cardiology Attending Cardiologist [facility]  Chrystie Nose, MD  12/09/2013 12:42 PM

## 2014-01-01 ENCOUNTER — Encounter: Payer: Self-pay | Admitting: *Deleted

## 2014-01-05 ENCOUNTER — Encounter: Payer: Self-pay | Admitting: Cardiovascular Disease

## 2014-01-06 ENCOUNTER — Ambulatory Visit (INDEPENDENT_AMBULATORY_CARE_PROVIDER_SITE_OTHER): Payer: Medicare Other | Admitting: Cardiovascular Disease

## 2014-01-06 VITALS — BP 110/72 | HR 65 | Ht 64.5 in | Wt 174.1 lb

## 2014-01-06 DIAGNOSIS — E785 Hyperlipidemia, unspecified: Secondary | ICD-10-CM

## 2014-01-06 DIAGNOSIS — R079 Chest pain, unspecified: Secondary | ICD-10-CM

## 2014-01-06 DIAGNOSIS — K219 Gastro-esophageal reflux disease without esophagitis: Secondary | ICD-10-CM

## 2014-01-06 DIAGNOSIS — E119 Type 2 diabetes mellitus without complications: Secondary | ICD-10-CM

## 2014-01-06 DIAGNOSIS — I2510 Atherosclerotic heart disease of native coronary artery without angina pectoris: Secondary | ICD-10-CM

## 2014-01-06 DIAGNOSIS — R0789 Other chest pain: Secondary | ICD-10-CM

## 2014-01-06 NOTE — Patient Instructions (Signed)
Your physician has recommended you make the following change in your medication:  Buy some naprosyn over the counter and take foe chest wall pain as directed by Dr. Tresa EndoKelly.  Your physician recommends that you schedule a follow-up appointment in: 6 MONTHS.

## 2014-01-06 NOTE — Progress Notes (Signed)
Patient ID: Lisa Gardner, female   DOB: [birth year], 78 y.o.   MRN: [id number]     HPI: Lisa Gardner is a 78 y.o. female who presents for one-year cardiology evaluation.  Lisa Gardner is a 78 year old female who had mild to moderate coronary disease documented by cardiac catheterization in 2008 which revealed a 30% left main stenosis, 40% diagonal, and 50% mid right coronary artery stenosis. She has been treated with medical therapy.  Additional problems also include type 2 diabetes mellitus, mixed hyperlipidemia, lower extremity edema, peripheral neuropathy, GERD, as well as a history of vitamin D insufficiency.  In June 2013 a nuclear perfusion study was essentially normal without scar or ischemia. On aggressive lipid-lowering therapy, in August 2013 in and him are like profile showed an LDL particle #922 and calculated LDL cholesterol at 63 with a total cholesterol 125 HDL 45. There is some resistance was increased.  She sees Dr. Osborne Casco for her primary care and she tells me that he has checked laboratory.  Recently, her daughter died with multiple sclerosis and also had a brain tumor. She's had 4 deaths of family members in 78 months. She admits to being under significant increased stress. In addition her husband was recently diagnosed with Alzheimer's disease.  At time she does not a sharp left chest discomfort which radiates to her back. She also notes indigestion almost daily. She tells me she did undergo an endoscopy and a colonoscopy.   Apparently recently she was seen as an add-on for chest pain by Cecilie Kicks, NP. At that time she was complaining of chest pain but no acute EKG changes were noted. Was felt that her chest pain most likely was atypical. She empirically added isosorbide as well as Flexeril. She also referred her for a nuclear perfusion study which was done on 12/09/2013. This showed an ejection fraction at 62%. She had scattered premature ventricular contractions  with stress. Scintigraphic images were normal without scar or ischemia.  Past Medical History  Diagnosis Date  . TIA (transient ischemic attack) 2009 and 2011  . COPD (chronic obstructive pulmonary disease)   . Diabetes mellitus without complication     insulin dependant  . GERD (gastroesophageal reflux disease)   . Diabetic neuropathy   . Neuromuscular disorder     Lumber Four-Five  . Coronary artery disease 2008    BY CATH, NON OBSTRUCTIVE, last nuc 2013 no ischemia EF 57%  . Hyperlipidemia LDL goal < 70     Past Surgical History  Procedure Laterality Date  . Back surgery  1998, 1975    Lumbar  . Joint replacement  Feb. 2011/August 2011    Bilateral Knees   . Abdominal hysterectomy  1975    Partial, still has ovaries.  . Tonsillectomy  1949  . Cardiac catheterization  2008    30% LM, 40% diag, 50% RCA    Allergies  Allergen Reactions  . Garlic Diarrhea  . Kiwi Extract     Tongue breaks out in blisters  . Vitamin E Rash    Current Outpatient Prescriptions  Medication Sig Dispense Refill  . Ascorbic Acid (VITAMIN C) 1000 MG tablet Take 1,000 mg by mouth daily.      Marland Kitchen aspirin 325 MG tablet Take 325 mg by mouth daily.      Marland Kitchen atorvastatin (LIPITOR) 80 MG tablet Take 80 mg by mouth daily.      . calcium-vitamin D (OSCAL WITH D) 250-125 MG-UNIT per tablet Take 2 tablets by mouth  daily.      Marland Kitchen CINNAMON PO Take by mouth daily.      . cyclobenzaprine (FLEXERIL) 5 MG tablet Take 1 tablet (5 mg total) by mouth 3 (three) times daily as needed for muscle spasms.  30 tablet  0  . DULoxetine (CYMBALTA) 60 MG capsule Take 60 mg by mouth daily.      Marland Kitchen ezetimibe (ZETIA) 10 MG tablet Take 10 mg by mouth daily.      . ferrous sulfate 325 (65 FE) MG tablet Take 325 mg by mouth daily with breakfast.      . furosemide (LASIX) 80 MG tablet Take 80 mg by mouth See admin instructions. Take 1 tablet by mouth alternating with 2 tablets every other day      . gabapentin (NEURONTIN) 300 MG  capsule Take 300 mg by mouth 3 (three) times daily.      Marland Kitchen glimepiride (AMARYL) 4 MG tablet Take 4 mg by mouth 2 times daily at 12 noon and 4 pm.      . Glucosamine-Chondroit-Vit C-Mn (GLUCOSAMINE 1500 COMPLEX PO) Take by mouth.      Marland Kitchen glucose blood test strip 1 each by Other route as needed. Use as instructed      . insulin aspart (NOVOLOG) 100 UNIT/ML injection Inject 38 Units into the skin daily.       . insulin detemir (LEVEMIR) 100 UNIT/ML injection Inject 38 Units into the skin daily.      . isosorbide dinitrate (ISORDIL) 30 MG tablet Take 30 mg by mouth 2 (two) times daily.      . metFORMIN (GLUCOPHAGE) 1000 MG tablet Take 1,000 mg by mouth 2 (two) times daily with a meal.      . Multiple Vitamins-Minerals (MULTIVITAMIN WITH MINERALS) tablet Take 1 tablet by mouth daily.      . multivitamin-lutein (OCUVITE-LUTEIN) CAPS Take 1 capsule by mouth 2 (two) times daily.       . nabumetone (RELAFEN) 750 MG tablet Take 750 mg by mouth as needed. Take 1 tablet by mouth once daily if needed      . potassium chloride (KLOR-CON) 20 MEQ packet Take 20 mEq by mouth 2 (two) times daily.       No current facility-administered medications for this visit.    History   Social History  . Marital Status: Married    Spouse Name: N/A    Number of Children: N/A  . Years of Education: N/A   Occupational History  . Not on file.   Social History Main Topics  . Smoking status: Never Smoker   . Smokeless tobacco: Never Used  . Alcohol Use: No  . Drug Use: No  . Sexual Activity: Not on file   Other Topics Concern  . Not on file   Social History Narrative  . No narrative on file    Family History  Problem Relation Age of Onset  . Kidney disease Mother   . Cancer Father   . Diabetes Brother   . Diabetes Brother   . Heart disease Brother   . Heart attack Brother   . Multiple sclerosis Daughter     ROS is negative for fevers, chills or night sweats. She denies headaches. She denies change in  vision or hearing. There is no wheezing. She is unaware of lymphadenopathy. She does have chest wall tenderness. She denies exertionally precipitation of chest pain. She also notes pain when she lifts her left arm upwards. She does admit to indigestion frequently.  She denies blood in stool or urine. She denies claudication. She denies leg swelling. She is diabetic. There is also a history of neuropathy. She does have hyperlipidemia. She does admit to increased anxiety and stress.  Other comprehensive 14 point system review is negative.  PE BP 110/72  Pulse 65  Ht 5' 4.5" (1.638 m)  Wt 174 lb 1.6 oz (78.971 kg)  BMI 29.43 kg/m2  General: Alert, oriented, no distress.  Skin: normal turgor, no rashes HEENT: Normocephalic, atraumatic. Pupils round and reactive; sclera anicteric;no lid lag. Extraocular muscles intact. Nose without nasal septal hypertrophy; no xanthelasma Mouth/Parynx benign; Mallinpatti scale 3 Neck: No JVD, no carotid bruits; normal carotid upstroke Lungs: clear to ausculatation and percussion; no wheezing or rales Chest wall:  tenderness to palpitation which mimics her chest pain that she has been experiencing Heart: RRR, s1 s2 normal 1/6 systolic murmur. No S3 or S4 gallop. No rub. Abdomen: soft, nontender; no hepatosplenomehaly, BS+; abdominal aorta nontender and not dilated by palpation. Back: no CVA tenderness Pulses 2+ Extremities: no clubbing cyanosis or edema, Homan's sign negative  Neurologic: grossly nonfocal; cranial nerves grossly normal. Psychologic: normal affect and mood.  ECG (independently read by me): Normal sinus rhythm at 65 beats per minute. Normal intervals. No significant ST changes.  LABS:  BMET    Component Value Date/Time   NA 136 08/07/2009 0505   K 3.7 08/07/2009 0505   CL 99 08/07/2009 0505   CO2 31 08/07/2009 0505   GLUCOSE 181* 08/07/2009 0505   BUN 9 08/07/2009 0505   CREATININE 0.78 08/07/2009 0505   CALCIUM 7.7* 08/07/2009 0505   GFRNONAA  >60 08/07/2009 0505   GFRAA  Value: >60        The eGFR has been calculated using the MDRD equation. This calculation has not been validated in all clinical situations. eGFR's persistently <60 mL/min signify possible Chronic Kidney Disease. 08/07/2009 0505     Hepatic Function Panel     Component Value Date/Time   PROT 5.7* 07/22/2007 0944   ALBUMIN 3.1* 07/22/2007 0944   AST 16 07/22/2007 0944   ALT 16 07/22/2007 0944   ALKPHOS 46 07/22/2007 0944   BILITOT 0.7 07/22/2007 0944   BILIDIR 0.1 07/22/2007 0944     CBC    Component Value Date/Time   WBC 6.7 08/07/2009 1258   RBC 3.13* 08/07/2009 1258   HGB 10.3 POST TRANSFUSION SPECIMEN* 08/07/2009 1258   HCT 30.4* 08/07/2009 1258   PLT 124* 08/07/2009 1258   MCV 97.2 08/07/2009 1258   MCHC 33.9 08/07/2009 1258   RDW 15.4 08/07/2009 1258   MONOABS 0.5 07/22/2007 0944   EOSABS 0.1 07/22/2007 0944   BASOSABS 0.0 07/22/2007 0944     BNP No results found for this basename: probnp    Lipid Panel  No results found for this basename: chol, trig, hdl, cholhdl, vldl, ldlcalc     RADIOLOGY: No results found.    ASSESSMENT AND PLAN: Lisa Gardner is a 77 year old female with established moderate coronary artery disease by cardiac catheterization in 2008. She has experienced recent episodes of chest discomfort with atypical features. On physical examination today he clearly has chest wall tenderness to palpation. I reviewed her recent nuclear perfusion study in detail and this continues to be unchanged and shows normal perfusion. There are no ECG changes of ischemia. I suspect her chest pain is musculoskeletal in etiology I have suggested a trial of Naprosyn which she can take over-the-counter in the form of  Aleve initially at 2 tablets twice a day. I also discussed taking proton pump inhibitor or H2 receptor blocker while taking this in light of her indigestion symptoms. She is on Lipitor 80 mg as well as diarrhea. I also discussed with her the  recent improve-it trial data. She is diabetic taking metformin as well as insulin. I have suggested she decrease her aspirin to 81 mg rather than 3 and 25 mg. She has been under increased stress recently with her deaths of family members in 69 months and a husband who recently has developed Alzheimer's disease. I will try to obtain the laboratory results most recently done by Dr.Tisovecand review these. As long as she remains stable, I will see her in 6 months for cardiology reevaluation.    Troy Sine, MD, [facility]  01/07/2014 6:32 PM

## 2014-01-07 ENCOUNTER — Encounter: Payer: Self-pay | Admitting: Cardiovascular Disease

## 2014-01-07 DIAGNOSIS — R0789 Other chest pain: Secondary | ICD-10-CM | POA: Insufficient documentation

## 2014-01-07 DIAGNOSIS — K219 Gastro-esophageal reflux disease without esophagitis: Secondary | ICD-10-CM | POA: Insufficient documentation

## 2014-02-17 ENCOUNTER — Telehealth: Payer: Self-pay | Admitting: Cardiovascular Disease

## 2014-02-17 NOTE — Telephone Encounter (Signed)
Is needing some clarification on the medication Isordil 30mg  and there are two different instructions.. Reference [id number]

## 2014-02-17 NOTE — Telephone Encounter (Signed)
Returned call to clarify medication instructions of isordil 30mg  (take 1 tab by mouth twice daily)

## 2014-03-18 ENCOUNTER — Ambulatory Visit: Payer: Self-pay | Admitting: Podiatry

## 2014-03-25 ENCOUNTER — Encounter: Payer: Self-pay | Admitting: Podiatry

## 2014-03-25 ENCOUNTER — Ambulatory Visit (INDEPENDENT_AMBULATORY_CARE_PROVIDER_SITE_OTHER): Payer: Medicare Other

## 2014-03-25 ENCOUNTER — Ambulatory Visit (INDEPENDENT_AMBULATORY_CARE_PROVIDER_SITE_OTHER): Payer: Medicare Other | Admitting: Podiatry

## 2014-03-25 VITALS — BP 151/78 | HR 60 | Resp 17 | Ht 64.5 in | Wt 170.0 lb

## 2014-03-25 DIAGNOSIS — E1049 Type 1 diabetes mellitus with other diabetic neurological complication: Secondary | ICD-10-CM

## 2014-03-25 DIAGNOSIS — M2040 Other hammer toe(s) (acquired), unspecified foot: Secondary | ICD-10-CM

## 2014-03-25 DIAGNOSIS — B351 Tinea unguium: Secondary | ICD-10-CM

## 2014-03-25 DIAGNOSIS — M79609 Pain in unspecified limb: Secondary | ICD-10-CM

## 2014-03-25 NOTE — Progress Notes (Signed)
   Subjective:    Patient ID: Lisa Gardner, female    DOB: 1936/03/12, 78 y.o.   MRN: [id number]  HPI Comments: Pt presents for debridement of 1 - 10 toenails.  Pt states unable to care for feet due to severe peripheral neuropathy due to diabetes, TIAs x2, and inability to balance.     Review of Systems  Constitutional: Positive for fatigue and unexpected weight change.  HENT: Positive for hearing loss and sinus pressure.   Eyes: Positive for visual disturbance.  Respiratory: Positive for shortness of breath.   Cardiovascular: Positive for leg swelling.       Dr Tresa EndoKelly is cardiologist  Gastrointestinal: Negative.   Endocrine: Negative.   Genitourinary: Negative.   Musculoskeletal: Positive for arthralgias and gait problem.       Osteoarthritis  Skin:       Increase in moles  Allergic/Immunologic: Positive for food allergies.       Garlic, kiwi  Neurological: Positive for dizziness and light-headedness.  Hematological: Negative.   Psychiatric/Behavioral: Negative.        Objective:   Physical Exam: I have reviewed her past medical history medications allergies surgeries social history. Pulses remain palpable decreases sorry per since once the monofilament. Deep tendon reflexes are in non-elicitable. Muscle strength is 4/5 dorsiflexors plantar flexors inverters everters. Orthopedic evaluation demonstrates mild hammertoe deformity mild HAV deformities but are asymptomatic cutaneous evaluation demonstrates supple well hydrated cutis nails are thick yellow dystrophic onychomycotic sharp incurvated and painful palpation as well as debridement.          Assessment & Plan:  Assessment: Pain in limb secondary to onychomycosis 1 through 5 bilateral.  Plan: Debridement of nails 1 through 5 bilateral covered service secondary to pain.

## 2014-04-30 ENCOUNTER — Ambulatory Visit (INDEPENDENT_AMBULATORY_CARE_PROVIDER_SITE_OTHER): Payer: Self-pay | Admitting: *Deleted

## 2014-04-30 DIAGNOSIS — M2040 Other hammer toe(s) (acquired), unspecified foot: Secondary | ICD-10-CM

## 2014-04-30 NOTE — Progress Notes (Signed)
   Subjective:    Patient ID: Lisa Gardner, female    DOB: Mar 07, 1936, 78 y.o.   MRN: [id number]  HPI DIABETIC SHOE MEASUREMENT   Review of Systems     Objective:   Physical Exam        Assessment & Plan:

## 2014-05-19 ENCOUNTER — Telehealth: Payer: Self-pay | Admitting: *Deleted

## 2014-05-19 NOTE — Telephone Encounter (Signed)
Impression foam was crushed and not usable.  Need to be remolded.  I called and left the patient a message to call and schedule an appointment for a Diabetic shoe insert molding.  I informed her she will not be charged for the visit and I apologized for the inconvenience.

## 2014-05-28 ENCOUNTER — Ambulatory Visit (INDEPENDENT_AMBULATORY_CARE_PROVIDER_SITE_OTHER): Payer: Medicare Other | Admitting: *Deleted

## 2014-05-28 DIAGNOSIS — M2040 Other hammer toe(s) (acquired), unspecified foot: Secondary | ICD-10-CM

## 2014-05-28 NOTE — Progress Notes (Signed)
   Subjective:    Patient ID: Lisa Gardner ParentsEsther R Nolde, female    DOB: 09/09/1936, 78 y.o.   MRN: [id number]  HPI  DIABETIC MEASUREMENT.    Review of Systems     Objective:   Physical Exam        Assessment & Plan:

## 2014-06-30 ENCOUNTER — Other Ambulatory Visit (HOSPITAL_COMMUNITY): Payer: Self-pay | Admitting: Internal Medicine

## 2014-06-30 DIAGNOSIS — Z1231 Encounter for screening mammogram for malignant neoplasm of breast: Secondary | ICD-10-CM

## 2014-07-06 ENCOUNTER — Encounter: Payer: Self-pay | Admitting: Podiatry

## 2014-07-06 ENCOUNTER — Ambulatory Visit (INDEPENDENT_AMBULATORY_CARE_PROVIDER_SITE_OTHER): Payer: Medicare Other | Admitting: Podiatry

## 2014-07-06 DIAGNOSIS — B351 Tinea unguium: Secondary | ICD-10-CM | POA: Diagnosis not present

## 2014-07-06 DIAGNOSIS — M79609 Pain in unspecified limb: Secondary | ICD-10-CM

## 2014-07-06 DIAGNOSIS — M79676 Pain in unspecified toe(s): Secondary | ICD-10-CM

## 2014-07-06 NOTE — Progress Notes (Signed)
Lisa Gardner presents today with a chief complaint of painful elongated toenails.  Objective: Pulses are palpable bilateral. Nails are thick yellow dystrophic onychomycotic and painful on palpation as well as debridement.  Assessment: Pain in limb secondary to onychomycosis 1 through 5 bilateral.  Plan: Debridement of nails 1 through 5 bilateral covered service secondary to pain.

## 2014-07-08 ENCOUNTER — Telehealth: Payer: Self-pay | Admitting: *Deleted

## 2014-07-08 NOTE — Telephone Encounter (Signed)
I'd like to know if my shoes have come in today?

## 2014-07-12 NOTE — Telephone Encounter (Signed)
Inserts arrived today and patient was notified.

## 2014-07-20 ENCOUNTER — Ambulatory Visit (INDEPENDENT_AMBULATORY_CARE_PROVIDER_SITE_OTHER): Payer: Medicare Other | Admitting: Podiatry

## 2014-07-20 DIAGNOSIS — M79676 Pain in unspecified toe(s): Secondary | ICD-10-CM

## 2014-07-20 DIAGNOSIS — E1049 Type 1 diabetes mellitus with other diabetic neurological complication: Secondary | ICD-10-CM

## 2014-07-20 DIAGNOSIS — M79609 Pain in unspecified limb: Secondary | ICD-10-CM

## 2014-07-20 DIAGNOSIS — M2040 Other hammer toe(s) (acquired), unspecified foot: Secondary | ICD-10-CM

## 2014-07-20 NOTE — Progress Notes (Signed)

## 2014-08-17 ENCOUNTER — Ambulatory Visit (HOSPITAL_COMMUNITY)
Admission: RE | Admit: 2014-08-17 | Discharge: 2014-08-17 | Disposition: A | Discharge status: Home / Self Care | Payer: Medicare Other | Source: Ambulatory Visit | Attending: Internal Medicine | Admitting: Internal Medicine

## 2014-08-17 ENCOUNTER — Ambulatory Visit (INDEPENDENT_AMBULATORY_CARE_PROVIDER_SITE_OTHER): Payer: Medicare Other | Admitting: Podiatry

## 2014-08-17 ENCOUNTER — Ambulatory Visit: Payer: Medicare Other | Admitting: Podiatry

## 2014-08-17 ENCOUNTER — Encounter: Payer: Self-pay | Admitting: Podiatry

## 2014-08-17 ENCOUNTER — Ambulatory Visit (HOSPITAL_COMMUNITY): Payer: Medicare Other

## 2014-08-17 DIAGNOSIS — M79676 Pain in unspecified toe(s): Secondary | ICD-10-CM

## 2014-08-17 DIAGNOSIS — Z1231 Encounter for screening mammogram for malignant neoplasm of breast: Secondary | ICD-10-CM | POA: Insufficient documentation

## 2014-08-17 DIAGNOSIS — M79609 Pain in unspecified limb: Secondary | ICD-10-CM

## 2014-08-17 NOTE — Progress Notes (Signed)
She presents today wearing her diabetic shoes for me to evaluate. She states that she has no complaints within.  Objective: Vital signs are stable she is alert and oriented x3 pulses are palpable bilateral. Small an increase in edema to the right lower extremity. This is more than likely associated with venous insufficiency.  Assessment: Diabetic peripheral neuropathy hammertoe deformities bilateral. He has insufficiency right.  Plan: Continue the use of the diabetic shoes followup with me as needed.

## 2014-08-22 ENCOUNTER — Emergency Department (HOSPITAL_COMMUNITY): Payer: Medicare Other

## 2014-08-22 ENCOUNTER — Emergency Department (HOSPITAL_COMMUNITY)
Admission: EM | Admit: 2014-08-22 | Discharge: 2014-08-22 | Disposition: A | Discharge status: Home / Self Care | Payer: Medicare Other | Attending: Emergency Medicine | Admitting: Emergency Medicine

## 2014-08-22 ENCOUNTER — Encounter (HOSPITAL_COMMUNITY): Payer: Self-pay | Admitting: Emergency Medicine

## 2014-08-22 DIAGNOSIS — Y9289 Other specified places as the place of occurrence of the external cause: Secondary | ICD-10-CM | POA: Diagnosis not present

## 2014-08-22 DIAGNOSIS — S46909A Unspecified injury of unspecified muscle, fascia and tendon at shoulder and upper arm level, unspecified arm, initial encounter: Secondary | ICD-10-CM | POA: Insufficient documentation

## 2014-08-22 DIAGNOSIS — IMO0002 Reserved for concepts with insufficient information to code with codable children: Secondary | ICD-10-CM | POA: Insufficient documentation

## 2014-08-22 DIAGNOSIS — E1142 Type 2 diabetes mellitus with diabetic polyneuropathy: Secondary | ICD-10-CM | POA: Diagnosis not present

## 2014-08-22 DIAGNOSIS — Y9389 Activity, other specified: Secondary | ICD-10-CM | POA: Diagnosis not present

## 2014-08-22 DIAGNOSIS — S59909A Unspecified injury of unspecified elbow, initial encounter: Secondary | ICD-10-CM | POA: Diagnosis not present

## 2014-08-22 DIAGNOSIS — Z7982 Long term (current) use of aspirin: Secondary | ICD-10-CM | POA: Diagnosis not present

## 2014-08-22 DIAGNOSIS — S6990XA Unspecified injury of unspecified wrist, hand and finger(s), initial encounter: Secondary | ICD-10-CM | POA: Diagnosis not present

## 2014-08-22 DIAGNOSIS — S0003XA Contusion of scalp, initial encounter: Secondary | ICD-10-CM | POA: Diagnosis not present

## 2014-08-22 DIAGNOSIS — I2510 Atherosclerotic heart disease of native coronary artery without angina pectoris: Secondary | ICD-10-CM | POA: Diagnosis not present

## 2014-08-22 DIAGNOSIS — E785 Hyperlipidemia, unspecified: Secondary | ICD-10-CM | POA: Insufficient documentation

## 2014-08-22 DIAGNOSIS — J449 Chronic obstructive pulmonary disease, unspecified: Secondary | ICD-10-CM | POA: Insufficient documentation

## 2014-08-22 DIAGNOSIS — S0990XA Unspecified injury of head, initial encounter: Secondary | ICD-10-CM | POA: Insufficient documentation

## 2014-08-22 DIAGNOSIS — Z79899 Other long term (current) drug therapy: Secondary | ICD-10-CM | POA: Insufficient documentation

## 2014-08-22 DIAGNOSIS — S298XXA Other specified injuries of thorax, initial encounter: Secondary | ICD-10-CM | POA: Insufficient documentation

## 2014-08-22 DIAGNOSIS — S4980XA Other specified injuries of shoulder and upper arm, unspecified arm, initial encounter: Secondary | ICD-10-CM | POA: Insufficient documentation

## 2014-08-22 DIAGNOSIS — Z794 Long term (current) use of insulin: Secondary | ICD-10-CM | POA: Insufficient documentation

## 2014-08-22 DIAGNOSIS — Z9889 Other specified postprocedural states: Secondary | ICD-10-CM | POA: Insufficient documentation

## 2014-08-22 DIAGNOSIS — W1809XA Striking against other object with subsequent fall, initial encounter: Secondary | ICD-10-CM | POA: Diagnosis not present

## 2014-08-22 DIAGNOSIS — Z90710 Acquired absence of both cervix and uterus: Secondary | ICD-10-CM | POA: Insufficient documentation

## 2014-08-22 DIAGNOSIS — W19XXXA Unspecified fall, initial encounter: Secondary | ICD-10-CM

## 2014-08-22 DIAGNOSIS — S3981XA Other specified injuries of abdomen, initial encounter: Secondary | ICD-10-CM | POA: Diagnosis not present

## 2014-08-22 DIAGNOSIS — Z8673 Personal history of transient ischemic attack (TIA), and cerebral infarction without residual deficits: Secondary | ICD-10-CM | POA: Diagnosis not present

## 2014-08-22 DIAGNOSIS — E1149 Type 2 diabetes mellitus with other diabetic neurological complication: Secondary | ICD-10-CM | POA: Diagnosis not present

## 2014-08-22 DIAGNOSIS — S79919A Unspecified injury of unspecified hip, initial encounter: Secondary | ICD-10-CM | POA: Insufficient documentation

## 2014-08-22 DIAGNOSIS — S59919A Unspecified injury of unspecified forearm, initial encounter: Secondary | ICD-10-CM

## 2014-08-22 DIAGNOSIS — K219 Gastro-esophageal reflux disease without esophagitis: Secondary | ICD-10-CM | POA: Diagnosis not present

## 2014-08-22 DIAGNOSIS — Z96659 Presence of unspecified artificial knee joint: Secondary | ICD-10-CM | POA: Insufficient documentation

## 2014-08-22 DIAGNOSIS — R55 Syncope and collapse: Secondary | ICD-10-CM | POA: Insufficient documentation

## 2014-08-22 DIAGNOSIS — R109 Unspecified abdominal pain: Secondary | ICD-10-CM | POA: Insufficient documentation

## 2014-08-22 DIAGNOSIS — S0083XA Contusion of other part of head, initial encounter: Principal | ICD-10-CM | POA: Insufficient documentation

## 2014-08-22 DIAGNOSIS — S1093XA Contusion of unspecified part of neck, initial encounter: Principal | ICD-10-CM

## 2014-08-22 DIAGNOSIS — J4489 Other specified chronic obstructive pulmonary disease: Secondary | ICD-10-CM | POA: Insufficient documentation

## 2014-08-22 DIAGNOSIS — S79929A Unspecified injury of unspecified thigh, initial encounter: Secondary | ICD-10-CM | POA: Diagnosis not present

## 2014-08-22 LAB — BASIC METABOLIC PANEL
Anion gap: 10 (ref 5–15)
BUN: 13 mg/dL (ref 6–23)
CO2: 28 mEq/L (ref 19–32)
Calcium: 9.2 mg/dL (ref 8.4–10.5)
Chloride: 99 mEq/L (ref 96–112)
Creatinine, Ser: 0.73 mg/dL (ref 0.50–1.10)
GFR, EST NON AFRICAN AMERICAN: 81 mL/min — AB (ref >90)
GLUCOSE: 342 mg/dL — AB (ref 70–99)
POTASSIUM: 4.1 meq/L (ref 3.7–5.3)
SODIUM: 137 meq/L (ref 137–147)

## 2014-08-22 LAB — I-STAT TROPONIN, ED: TROPONIN I, POC: 0 ng/mL (ref 0.00–0.08)

## 2014-08-22 LAB — CBC
HCT: 39.4 % (ref 36.0–46.0)
HEMOGLOBIN: 14.2 g/dL (ref 12.0–15.0)
MCH: 32.9 pg (ref 26.0–34.0)
MCHC: 36 g/dL (ref 30.0–36.0)
MCV: 91.4 fL (ref 78.0–100.0)
PLATELETS: 147 10*3/uL — AB (ref 150–400)
RBC: 4.31 MIL/uL (ref 3.87–5.11)
RDW: 12.5 % (ref 11.5–15.5)
WBC: 6.3 10*3/uL (ref 4.0–10.5)

## 2014-08-22 MED ORDER — TRAMADOL HCL 50 MG PO TABS: 50.0000 mg | ORAL_TABLET | Freq: Four times a day (QID) | ORAL | Status: AC | PRN

## 2014-08-22 MED ORDER — TRAMADOL HCL 50 MG PO TABS
50.0000 mg | ORAL_TABLET | Freq: Once | ORAL | Status: AC
  Administered 2014-08-22: 50 mg via ORAL
  Filled 2014-08-22: qty 1

## 2014-08-22 NOTE — ED Notes (Signed)
Pt's nose is red and slight bruise above right eyebrow from fall

## 2014-08-22 NOTE — ED Notes (Signed)
Per rapid response nurse upstairs, pt was visiting husband. Used the bathroom, lost balance while sitting on commode and hit top of head and front of face on the toilet. Pt also c/o R shoulder, wrist, and hip pain. Abrasion and hematoma noted to top of head. Swelling and redness to nose and orbital area. No obvious injury, bruising, or swelling noted to wrist, shoulder, or hip. Pt has Full ROM. C collar in place. AAOX4.

## 2014-08-22 NOTE — ED Provider Notes (Signed)
CSN: [id number]     Arrival date & time 08/22/14  1400 History   First MD Initiated Contact with Patient 08/22/14 1503     Chief Complaint  Patient presents with  . Fall     (Consider location/radiation/quality/duration/timing/severity/associated sxs/prior Treatment) HPI Comments: 78 year old female here after syncopal episode. She is on the toilet. She fell forward and hit her head and landed flat on her face. She does not remember any preceding chest pain, shortness of breath, nausea, vomiting, diarrhea. She's here feeling well, complaining of right-sided pain.  Patient is a 78 y.o. female presenting with fall. The history is provided by the patient.  Fall This is a new problem. The current episode started less than 1 hour ago. Episode frequency: once. The problem has not changed since onset.Pertinent negatives include no chest pain, no abdominal pain, no headaches and no shortness of breath. Nothing aggravates the symptoms. Nothing relieves the symptoms. She has tried nothing for the symptoms.    Past Medical History  Diagnosis Date  . TIA (transient ischemic attack) 2009 and 2011  . COPD (chronic obstructive pulmonary disease)   . Diabetes mellitus without complication     insulin dependant  . GERD (gastroesophageal reflux disease)   . Diabetic neuropathy   . Neuromuscular disorder     [place]Five  . Coronary artery disease 2008    BY CATH, NON OBSTRUCTIVE, last nuc 2013 no ischemia EF 57%  . Hyperlipidemia LDL goal < 70    Past Surgical History  Procedure Laterality Date  . Back surgery  1998, 1975    Lumbar  . Joint replacement  Feb. 2011/August 2011    Bilateral Knees   . Abdominal hysterectomy  1975    Partial, still has ovaries.  . Tonsillectomy  1949  . Cardiac catheterization  2008    30% LM, 40% diag, 50% RCA   Family History  Problem Relation Age of Onset  . Kidney disease Mother   . Cancer Father   . Diabetes Brother   . Diabetes Brother   . Heart  disease Brother   . Heart attack Brother   . Multiple sclerosis Daughter    History  Substance Use Topics  . Smoking status: Never Smoker   . Smokeless tobacco: Never Used  . Alcohol Use: No   OB History   Grav Para Term Preterm Abortions TAB SAB Ect Mult Living                 Review of Systems  Constitutional: Negative for fever and chills.  Respiratory: Negative for shortness of breath.   Cardiovascular: Negative for chest pain.  Gastrointestinal: Negative for abdominal pain.  Neurological: Negative for headaches.  All other systems reviewed and are negative.     Allergies  Garlic; Kiwi extract; and Vitamin e  Home Medications   Prior to Admission medications   Medication Sig Start Date End Date Taking? Authorizing Provider  Ascorbic Acid (VITAMIN C) 1000 MG tablet Take 1,000 mg by mouth daily.   Yes Historical Provider, MD  aspirin EC 81 MG tablet Take 81 mg by mouth at bedtime.   Yes Historical Provider, MD  atorvastatin (LIPITOR) 80 MG tablet Take 80 mg by mouth daily.   Yes Historical Provider, MD  calcium-vitamin D (OSCAL WITH D) 250-125 MG-UNIT per tablet Take 2 tablets by mouth daily. 11/04/12  Yes Historical Provider, MD  CINNAMON PO Take 2 tablets by mouth daily.    Yes Historical Provider, MD  DULoxetine (  CYMBALTA) 60 MG capsule Take 60 mg by mouth daily.   Yes Historical Provider, MD  ezetimibe (ZETIA) 10 MG tablet Take 10 mg by mouth daily.   Yes Historical Provider, MD  ferrous sulfate 325 (65 FE) MG tablet Take 325 mg by mouth daily with breakfast.   Yes Historical Provider, MD  furosemide (LASIX) 80 MG tablet Take 80-160 mg by mouth daily. Take 1 tablet by mouth alternating with 2 tablets every other day   Yes Historical Provider, MD  gabapentin (NEURONTIN) 300 MG capsule Take 300 mg by mouth 3 (three) times daily.   Yes Historical Provider, MD  glimepiride (AMARYL) 4 MG tablet Take 4 mg by mouth 2 times daily at 12 noon and 4 pm.   Yes Historical  Provider, MD  insulin aspart (NOVOLOG) 100 UNIT/ML injection Inject 14-18 Units into the skin 3 (three) times daily with meals. 18 units in the am and pm. 14 units at lunch   Yes Historical Provider, MD  insulin detemir (LEVEMIR) 100 UNIT/ML injection Inject 38 Units into the skin daily.   Yes Historical Provider, MD  isosorbide dinitrate (ISORDIL) 30 MG tablet Take 30 mg by mouth daily.  12/07/13  Yes Nada Boozer, NP  losartan (COZAAR) 50 MG tablet Take 50 mg by mouth daily.  07/12/14  Yes Historical Provider, MD  metFORMIN (GLUCOPHAGE) 1000 MG tablet Take 1,000 mg by mouth 2 (two) times daily with a meal.   Yes Historical Provider, MD  Multiple Vitamins-Minerals (MULTIVITAMIN WITH MINERALS) tablet Take 1 tablet by mouth daily.   Yes Historical Provider, MD  multivitamin-lutein (OCUVITE-LUTEIN) CAPS Take 1 capsule by mouth 2 (two) times daily.    Yes Historical Provider, MD  omeprazole (PRILOSEC) 40 MG capsule Take 40 mg by mouth daily.  07/12/14  Yes Historical Provider, MD  potassium chloride (KLOR-CON) 20 MEQ packet Take 20 mEq by mouth daily.    Yes Historical Provider, MD   BP 158/75  Pulse 63  Temp(Src) 97.8 F (36.6 C) (Oral)  Resp 19  SpO2 100% Physical Exam  Nursing note and vitals reviewed. Constitutional: She is oriented to person, place, and time. She appears well-developed and well-nourished. No distress.  HENT:  Head: Normocephalic.    Mouth/Throat: Oropharynx is clear and moist.  Eyes: EOM are normal. Pupils are equal, round, and reactive to light.  Neck: Normal range of motion. Neck supple.  Cardiovascular: Normal rate and regular rhythm.  Exam reveals no friction rub.   No murmur heard. Pulmonary/Chest: Effort normal and breath sounds normal. No respiratory distress. She has no wheezes. She has no rales.  Abdominal: Soft. She exhibits no distension. There is no tenderness. There is no rebound.  Musculoskeletal: Normal range of motion. She exhibits no edema.        Cervical back: She exhibits normal range of motion, no tenderness and no bony tenderness.  Neurological: She is alert and oriented to person, place, and time.  Skin: No rash noted. She is not diaphoretic.    ED Course  Procedures (including critical care time) Labs Review Labs Reviewed  CBC  BASIC METABOLIC PANEL    Imaging Review Ct Abdomen Pelvis Wo Contrast  08/22/2014   CLINICAL DATA:  Ground level fall today. Abdominal pain in the epigastric area.  EXAM: CT ABDOMEN AND PELVIS WITHOUT CONTRAST  TECHNIQUE: Multidetector CT imaging of the abdomen and pelvis was performed following the standard protocol without IV contrast.  COMPARISON:  None.  FINDINGS: Musculoskeletal: Bone graft harvesting site in  the medial RIGHT iliac bone. The pelvic rings appear intact. L4-L5 Ray cage fusion grafts and posterior lateral fusion at L5-S1. No aggressive osseous lesions. Prominent L1 and L3 vertebral body hemangiomata. Negative for compression fracture.  Lung Bases: Dependent atelectasis.  Liver: Unenhanced CT was performed per clinician order. Lack of IV contrast limits sensitivity and specificity, especially for evaluation of abdominal/pelvic solid viscera. Normal variant Riedel's lobe of the liver.  Spleen:  Normal.  Gallbladder:  Contracted.  No calcified stones.  Common bile duct:  Normal.  Pancreas:  Normal.  Adrenal glands:  LEFT adrenal calcifications, likely granulomatous.  Kidneys: Normal. No stones or hydronephrosis. Both ureters appear normal.  Stomach:  Normal.  Small bowel:  Normal duodenum.  No small bowel adenopathy.  Colon:   Normal.  Pelvic Genitourinary:  Normal urinary bladder.  Hysterectomy.  Vasculature: Atherosclerosis.  Body Wall: Paraspinal muscular atrophy.  IMPRESSION: No acute abnormality.  Postoperative changes the lumbar spine.   Electronically Signed   By: Andreas Newport M.D.   On: 08/22/2014 17:21   Dg Chest 2 View  08/22/2014   CLINICAL DATA:  Fall.  Head trauma.  RIGHT-sided  chest pain.  EXAM: CHEST  2 VIEW  COMPARISON:  CT 08/07/2009.  Chest radiograph 01/21/2009.  FINDINGS: Cardiopericardial silhouette within normal limits. Mediastinal contours normal. Trachea midline. No airspace disease or effusion. Lung volumes are lower than on prior. No pneumothorax. No displaced rib fractures. Thoracic spine shows degenerative changes without a compression fracture.  IMPRESSION: No active cardiopulmonary disease.   Electronically Signed   By: Andreas Newport M.D.   On: 08/22/2014 16:48   Dg Shoulder Right  08/22/2014   CLINICAL DATA:  Fall.  EXAM: RIGHT SHOULDER - 2+ VIEW  COMPARISON:  None.  FINDINGS: There is no evidence of fracture or dislocation. Mild degenerative changes involve the acromioclavicular joint. Soft tissues are unremarkable.  IMPRESSION: 1. No acute findings. 2. Mild osteoarthritis at the [facility] joint.   Electronically Signed   By: Signa Kell M.D.   On: 08/22/2014 16:47   Dg Elbow Complete Right  08/22/2014   CLINICAL DATA:  Fall.  Pain.  EXAM: RIGHT ELBOW - COMPLETE 3+ VIEW  COMPARISON:  None.  FINDINGS: There is no evidence of fracture, dislocation, or joint effusion. There is no evidence of arthropathy or other focal bone abnormality. Soft tissues are unremarkable.  IMPRESSION: Negative.   Electronically Signed   By: Britta Mccreedy M.D.   On: 08/22/2014 16:45   Dg Wrist Complete Right  08/22/2014   CLINICAL DATA:  Fall in bathroom.  Pain on right wrist.  EXAM: RIGHT WRIST - COMPLETE 3+ VIEW  COMPARISON:  None.  FINDINGS: There is no evidence of fracture or dislocation. There is no evidence of arthropathy or other focal bone abnormality. Soft tissues are unremarkable.  IMPRESSION: Negative.   Electronically Signed   By: Signa Kell M.D.   On: 08/22/2014 16:46   Dg Hip Complete Right  08/22/2014   CLINICAL DATA:  Fall.  Pain in right hip.  EXAM: RIGHT HIP - COMPLETE 2+ VIEW  COMPARISON:  None.  FINDINGS: Bones are osteopenic. Right hip is located. Slight joint  space narrowing of both hips, right greater than left. No acute fracture is identified.  There are postoperative changes with inner body grafts at L5-S1.  IMPRESSION: Osteopenia of the bones. No acute fracture is identified. If there is continued clinical concern for fracture consider further evaluation with MRI (preferred) or noncontrast CT.  Electronically Signed   By: Britta Mccreedy M.D.   On: 08/22/2014 16:57   Ct Head Wo Contrast  08/22/2014   CLINICAL DATA:  Fall.  Bruising over the RIGHT IIA.  EXAM: CT HEAD WITHOUT CONTRAST  TECHNIQUE: Contiguous axial images were obtained from the base of the skull through the vertex without intravenous contrast.  COMPARISON:  05/19/2006.  FINDINGS: Scout image appears within normal limits. No mass lesion, mass effect, midline shift, hydrocephalus, hemorrhage. No territorial ischemia or acute infarction. Mild atherosclerotic calcification in the vertebral basilar system. There is a tiny calcification in the LEFT posterior frontal lobe which corresponds with the area of punctate infarction on prior MRI from 2007. Paranasal sinuses appear within normal limits.  IMPRESSION: No acute intracranial abnormality.   Electronically Signed   By: Andreas Newport M.D.   On: 08/22/2014 17:23     EKG Interpretation   Date/Time:  Sunday August 22 2014 15:54:23 EDT Ventricular Rate:  62 PR Interval:  186 QRS Duration: 113 QT Interval:  468 QTC Calculation: 475 R Axis:   62 Text Interpretation:  Age not entered, assumed to be  78 years old for  purpose of ECG interpretation Sinus rhythm Borderline intraventricular  conduction delay Low voltage, precordial leads Borderline prolonged QT  interval Similar to prior Confirmed by Gwendolyn Grant  MD, Miaya Lafontant ([id number]) on  08/22/2014 11:39:54 PM      MDM   Final diagnoses:  Fall, initial encounter  Vasovagal syncope    78 rolled female status post fall with syncope. She syncopized on the phone and fell forward. She has some right  sided abdominal pain, right-sided chest pain, right hip pain, right shoulder pain, right elbow, right wrist pain. No neck or other spinal pain. She has a small hematoma on her head without laceration. All joints have full range of motion and there are no extremity deformities. Will x-ray the joints in her chest. She has a right mid flank pain. I explained to her we need do CT with contrast, but she is stage IV kidney disease. I talked with her about benefits and risks, she would rather not receiving contrast. Will do screening CT in repeat if suspicious for serious injury. CT ok, xrays ok. Patient's labs ok. I spoke with [facility], Dr. Link Snuffer, who will arrange f/u tomorrow. Stable for discharge.  Elwin Mocha, MD 08/22/14 [phone]

## 2014-08-22 NOTE — Discharge Instructions (Signed)
Syncope °Syncope is a medical term for fainting or passing out. This means you lose consciousness and drop to the ground. People are generally unconscious for less than 5 minutes. You may have some muscle twitches for up to 15 seconds before waking up and returning to normal. Syncope occurs more often in older adults, but it can happen to anyone. While most causes of syncope are not dangerous, syncope can be a sign of a serious medical problem. It is important to seek medical care.  °CAUSES  °Syncope is caused by a sudden drop in blood flow to the brain. The specific cause is often not determined. Factors that can bring on syncope include: °· Taking medicines that lower blood pressure. °· Sudden changes in posture, such as standing up quickly. °· Taking more medicine than prescribed. °· Standing in one place for too long. °· Seizure disorders. °· Dehydration and excessive exposure to heat. °· Low blood sugar (hypoglycemia). °· Straining to have a bowel movement. °· Heart disease, irregular heartbeat, or other circulatory problems. °· Fear, emotional distress, seeing blood, or severe pain. °SYMPTOMS  °Right before fainting, you may: °· Feel dizzy or light-headed. °· Feel nauseous. °· See all white or all black in your field of vision. °· Have cold, clammy skin. °DIAGNOSIS  °Your health care provider will ask about your symptoms, perform a physical exam, and perform an electrocardiogram (ECG) to record the electrical activity of your heart. Your health care provider may also perform other heart or blood tests to determine the cause of your syncope which may include: °· Transthoracic echocardiogram (TTE). During echocardiography, sound waves are used to evaluate how blood flows through your heart. °· Transesophageal echocardiogram (TEE). °· Cardiac monitoring. This allows your health care provider to monitor your heart rate and rhythm in real time. °· Holter monitor. This is a portable device that records your  heartbeat and can help diagnose heart arrhythmias. It allows your health care provider to track your heart activity for several days, if needed. °· Stress tests by exercise or by giving medicine that makes the heart beat faster. °TREATMENT  °In most cases, no treatment is needed. Depending on the cause of your syncope, your health care provider may recommend changing or stopping some of your medicines. °HOME CARE INSTRUCTIONS °· Have someone stay with you until you feel stable. °· Do not drive, use machinery, or play sports until your health care provider says it is okay. °· Keep all follow-up appointments as directed by your health care provider. °· Lie down right away if you start feeling like you might faint. Breathe deeply and steadily. Wait until all the symptoms have passed. °· Drink enough fluids to keep your urine clear or pale yellow. °· If you are taking blood pressure or heart medicine, get up slowly and take several minutes to sit and then stand. This can reduce dizziness. °SEEK IMMEDIATE MEDICAL CARE IF:  °· You have a severe headache. °· You have unusual pain in the chest, abdomen, or back. °· You are bleeding from your mouth or rectum, or you have black or tarry stool. °· You have an irregular or very fast heartbeat. °· You have pain with breathing. °· You have repeated fainting or seizure-like jerking during an episode. °· You faint when sitting or lying down. °· You have confusion. °· You have trouble walking. °· You have severe weakness. °· You have vision problems. °If you fainted, call your local emergency services (911 in [place] Do not drive   yourself to the hospital.  °MAKE SURE YOU: °· Understand these instructions. °· Will watch your condition. °· Will get help right away if you are not doing well or get worse. °Document Released: 11/26/2005 Document Revised: 12/01/2013 Document Reviewed: 01/25/2012 °Exit[employer]® Patient Information ©2015 [employer]. This information is not intended to replace  advice given to you by your health care provider. Make sure you discuss any questions you have with your health care provider. ° °

## 2014-08-23 LAB — CBG MONITORING, ED: Glucose-Capillary: 377 mg/dL — ABNORMAL HIGH (ref 70–99)

## 2014-10-12 ENCOUNTER — Ambulatory Visit: Payer: Medicare Other | Admitting: Podiatry

## 2014-10-14 ENCOUNTER — Ambulatory Visit (INDEPENDENT_AMBULATORY_CARE_PROVIDER_SITE_OTHER): Payer: Medicare Other | Admitting: Podiatry

## 2014-10-14 DIAGNOSIS — B351 Tinea unguium: Secondary | ICD-10-CM | POA: Diagnosis not present

## 2014-10-14 DIAGNOSIS — M79673 Pain in unspecified foot: Secondary | ICD-10-CM | POA: Diagnosis not present

## 2014-10-14 NOTE — Progress Notes (Signed)
   Subjective:    Patient ID: Lisa Gardner, female    DOB: Sep 07, 1936, 78 y.o.   MRN: [id number]  HPI  Pt is here for nail debridement  Review of Systems     Objective:   Physical Exam        Assessment & Plan:

## 2014-10-14 NOTE — Progress Notes (Signed)
Presents today chief complaint of painful elongated toenails.  Objective: Pulses are palpable bilateral nails are thick, yellow dystrophic onychomycosis and painful palpation.   Assessment: Onychomycosis with pain in limb.  Plan: Treatment of nails in thickness and length as covered service secondary to pain.  

## 2015-01-13 ENCOUNTER — Ambulatory Visit: Payer: Medicare Other | Admitting: Podiatry

## 2015-01-27 ENCOUNTER — Inpatient Hospital Stay (HOSPITAL_COMMUNITY)
Admission: EM | Admit: 2015-01-27 | Discharge: 2015-02-01 | DRG: 871 | Disposition: A | Discharge status: Skilled Nursing Facility | Payer: Medicare Other | Attending: Internal Medicine | Admitting: Internal Medicine

## 2015-01-27 ENCOUNTER — Emergency Department (HOSPITAL_COMMUNITY): Payer: Medicare Other

## 2015-01-27 ENCOUNTER — Encounter (HOSPITAL_COMMUNITY): Payer: Self-pay

## 2015-01-27 DIAGNOSIS — G9341 Metabolic encephalopathy: Secondary | ICD-10-CM | POA: Diagnosis present

## 2015-01-27 DIAGNOSIS — A4151 Sepsis due to Escherichia coli [E. coli]: Secondary | ICD-10-CM | POA: Diagnosis present

## 2015-01-27 DIAGNOSIS — I2510 Atherosclerotic heart disease of native coronary artery without angina pectoris: Secondary | ICD-10-CM | POA: Diagnosis present

## 2015-01-27 DIAGNOSIS — Z9114 Patient's other noncompliance with medication regimen: Secondary | ICD-10-CM | POA: Diagnosis present

## 2015-01-27 DIAGNOSIS — Z809 Family history of malignant neoplasm, unspecified: Secondary | ICD-10-CM

## 2015-01-27 DIAGNOSIS — Z794 Long term (current) use of insulin: Secondary | ICD-10-CM

## 2015-01-27 DIAGNOSIS — D696 Thrombocytopenia, unspecified: Secondary | ICD-10-CM | POA: Diagnosis present

## 2015-01-27 DIAGNOSIS — K219 Gastro-esophageal reflux disease without esophagitis: Secondary | ICD-10-CM | POA: Diagnosis present

## 2015-01-27 DIAGNOSIS — Z79899 Other long term (current) drug therapy: Secondary | ICD-10-CM | POA: Diagnosis not present

## 2015-01-27 DIAGNOSIS — N390 Urinary tract infection, site not specified: Secondary | ICD-10-CM | POA: Diagnosis present

## 2015-01-27 DIAGNOSIS — Z8673 Personal history of transient ischemic attack (TIA), and cerebral infarction without residual deficits: Secondary | ICD-10-CM | POA: Diagnosis not present

## 2015-01-27 DIAGNOSIS — Z90710 Acquired absence of both cervix and uterus: Secondary | ICD-10-CM | POA: Diagnosis not present

## 2015-01-27 DIAGNOSIS — F331 Major depressive disorder, recurrent, moderate: Secondary | ICD-10-CM | POA: Diagnosis present

## 2015-01-27 DIAGNOSIS — I10 Essential (primary) hypertension: Secondary | ICD-10-CM | POA: Diagnosis present

## 2015-01-27 DIAGNOSIS — Z96653 Presence of artificial knee joint, bilateral: Secondary | ICD-10-CM | POA: Diagnosis present

## 2015-01-27 DIAGNOSIS — Z833 Family history of diabetes mellitus: Secondary | ICD-10-CM

## 2015-01-27 DIAGNOSIS — A419 Sepsis, unspecified organism: Secondary | ICD-10-CM

## 2015-01-27 DIAGNOSIS — E669 Obesity, unspecified: Secondary | ICD-10-CM | POA: Diagnosis present

## 2015-01-27 DIAGNOSIS — E876 Hypokalemia: Secondary | ICD-10-CM | POA: Diagnosis present

## 2015-01-27 DIAGNOSIS — Z6828 Body mass index (BMI) 28.0-28.9, adult: Secondary | ICD-10-CM | POA: Diagnosis not present

## 2015-01-27 DIAGNOSIS — Z7982 Long term (current) use of aspirin: Secondary | ICD-10-CM

## 2015-01-27 DIAGNOSIS — E871 Hypo-osmolality and hyponatremia: Secondary | ICD-10-CM | POA: Diagnosis present

## 2015-01-27 DIAGNOSIS — Z8249 Family history of ischemic heart disease and other diseases of the circulatory system: Secondary | ICD-10-CM

## 2015-01-27 DIAGNOSIS — R410 Disorientation, unspecified: Secondary | ICD-10-CM

## 2015-01-27 DIAGNOSIS — E1165 Type 2 diabetes mellitus with hyperglycemia: Secondary | ICD-10-CM | POA: Diagnosis present

## 2015-01-27 DIAGNOSIS — J449 Chronic obstructive pulmonary disease, unspecified: Secondary | ICD-10-CM | POA: Diagnosis present

## 2015-01-27 DIAGNOSIS — R4182 Altered mental status, unspecified: Secondary | ICD-10-CM | POA: Diagnosis present

## 2015-01-27 DIAGNOSIS — E785 Hyperlipidemia, unspecified: Secondary | ICD-10-CM | POA: Diagnosis present

## 2015-01-27 DIAGNOSIS — E860 Dehydration: Secondary | ICD-10-CM | POA: Diagnosis present

## 2015-01-27 DIAGNOSIS — Z9119 Patient's noncompliance with other medical treatment and regimen: Secondary | ICD-10-CM | POA: Diagnosis present

## 2015-01-27 DIAGNOSIS — E46 Unspecified protein-calorie malnutrition: Secondary | ICD-10-CM | POA: Diagnosis present

## 2015-01-27 DIAGNOSIS — IMO0002 Reserved for concepts with insufficient information to code with codable children: Secondary | ICD-10-CM

## 2015-01-27 DIAGNOSIS — N179 Acute kidney failure, unspecified: Secondary | ICD-10-CM

## 2015-01-27 LAB — COMPREHENSIVE METABOLIC PANEL
ALBUMIN: 2.6 g/dL — AB (ref 3.5–5.2)
ALT: 22 U/L (ref 0–35)
AST: 28 U/L (ref 0–37)
Alkaline Phosphatase: 79 U/L (ref 39–117)
Anion gap: 10 (ref 5–15)
BILIRUBIN TOTAL: 1.3 mg/dL — AB (ref 0.3–1.2)
BUN: 20 mg/dL (ref 6–23)
CO2: 22 mmol/L (ref 19–32)
Calcium: 8 mg/dL — ABNORMAL LOW (ref 8.4–10.5)
Chloride: 98 mmol/L (ref 96–112)
Creatinine, Ser: 1.53 mg/dL — ABNORMAL HIGH (ref 0.50–1.10)
GFR calc Af Amer: 37 mL/min — ABNORMAL LOW (ref >90)
GFR calc non Af Amer: 32 mL/min — ABNORMAL LOW (ref >90)
GLUCOSE: 241 mg/dL — AB (ref 70–99)
POTASSIUM: 3.9 mmol/L (ref 3.5–5.1)
Sodium: 130 mmol/L — ABNORMAL LOW (ref 135–145)
Total Protein: 5.4 g/dL — ABNORMAL LOW (ref 6.0–8.3)

## 2015-01-27 LAB — CBC WITH DIFFERENTIAL/PLATELET
BASOS PCT: 0 % (ref 0–1)
Basophils Absolute: 0 10*3/uL (ref 0.0–0.1)
EOS ABS: 0 10*3/uL (ref 0.0–0.7)
Eosinophils Relative: 0 % (ref 0–5)
HEMATOCRIT: 36.1 % (ref 36.0–46.0)
HEMOGLOBIN: 13 g/dL (ref 12.0–15.0)
Lymphocytes Relative: 5 % — ABNORMAL LOW (ref 12–46)
Lymphs Abs: 0.4 10*3/uL — ABNORMAL LOW (ref 0.7–4.0)
MCH: 33.1 pg (ref 26.0–34.0)
MCHC: 36 g/dL (ref 30.0–36.0)
MCV: 91.9 fL (ref 78.0–100.0)
MONO ABS: 0.9 10*3/uL (ref 0.1–1.0)
MONOS PCT: 11 % (ref 3–12)
NEUTROS ABS: 7.5 10*3/uL (ref 1.7–7.7)
Neutrophils Relative %: 85 % — ABNORMAL HIGH (ref 43–77)
Platelets: 111 10*3/uL — ABNORMAL LOW (ref 150–400)
RBC: 3.93 MIL/uL (ref 3.87–5.11)
RDW: 12.6 % (ref 11.5–15.5)
WBC: 8.8 10*3/uL (ref 4.0–10.5)

## 2015-01-27 LAB — URINALYSIS, ROUTINE W REFLEX MICROSCOPIC
Bilirubin Urine: NEGATIVE
Glucose, UA: NEGATIVE mg/dL
Ketones, ur: 15 mg/dL — AB
NITRITE: POSITIVE — AB
Protein, ur: 30 mg/dL — AB
Specific Gravity, Urine: 1.012 (ref 1.005–1.030)
UROBILINOGEN UA: 1 mg/dL (ref 0.0–1.0)
pH: 5 (ref 5.0–8.0)

## 2015-01-27 LAB — I-STAT TROPONIN, ED
Troponin i, poc: 0.03 ng/mL (ref 0.00–0.08)
Troponin i, poc: 0.04 ng/mL (ref 0.00–0.08)

## 2015-01-27 LAB — URINE MICROSCOPIC-ADD ON

## 2015-01-27 LAB — I-STAT CG4 LACTIC ACID, ED: Lactic Acid, Venous: 1.76 mmol/L (ref 0.5–2.0)

## 2015-01-27 LAB — CBG MONITORING, ED: Glucose-Capillary: 250 mg/dL — ABNORMAL HIGH (ref 70–99)

## 2015-01-27 MED ORDER — ATORVASTATIN CALCIUM 80 MG PO TABS
80.0000 mg | ORAL_TABLET | Freq: Every day | ORAL | Status: DC
  Administered 2015-01-28 – 2015-02-01 (×5): 80 mg via ORAL
  Filled 2015-01-27 (×5): qty 1

## 2015-01-27 MED ORDER — DULOXETINE HCL 60 MG PO CPEP
60.0000 mg | ORAL_CAPSULE | Freq: Every day | ORAL | Status: DC
  Administered 2015-01-28 – 2015-02-01 (×5): 60 mg via ORAL
  Filled 2015-01-27 (×5): qty 1

## 2015-01-27 MED ORDER — EZETIMIBE 10 MG PO TABS
10.0000 mg | ORAL_TABLET | Freq: Every day | ORAL | Status: DC
  Administered 2015-01-28 – 2015-02-01 (×5): 10 mg via ORAL
  Filled 2015-01-27 (×5): qty 1

## 2015-01-27 MED ORDER — ACETAMINOPHEN 325 MG PO TABS
650.0000 mg | ORAL_TABLET | Freq: Four times a day (QID) | ORAL | Status: DC | PRN
  Administered 2015-01-28 – 2015-02-01 (×8): 650 mg via ORAL
  Filled 2015-01-27 (×8): qty 2

## 2015-01-27 MED ORDER — INSULIN ASPART 100 UNIT/ML ~~LOC~~ SOLN
0.0000 [IU] | Freq: Three times a day (TID) | SUBCUTANEOUS | Status: DC
  Administered 2015-01-28: 3 [IU] via SUBCUTANEOUS
  Administered 2015-01-28: 4 [IU] via SUBCUTANEOUS
  Administered 2015-01-28 – 2015-01-29 (×4): 3 [IU] via SUBCUTANEOUS
  Administered 2015-01-30: 4 [IU] via SUBCUTANEOUS

## 2015-01-27 MED ORDER — ASPIRIN EC 81 MG PO TBEC
81.0000 mg | DELAYED_RELEASE_TABLET | Freq: Every day | ORAL | Status: DC
  Administered 2015-01-27 – 2015-01-31 (×5): 81 mg via ORAL
  Filled 2015-01-27 (×6): qty 1

## 2015-01-27 MED ORDER — SODIUM CHLORIDE 0.9 % IV BOLUS (SEPSIS): 1000.0000 mL | Freq: Once | INTRAVENOUS | Status: DC

## 2015-01-27 MED ORDER — ACETAMINOPHEN 500 MG PO TABS
1000.0000 mg | ORAL_TABLET | Freq: Once | ORAL | Status: AC
  Administered 2015-01-27: 1000 mg via ORAL
  Filled 2015-01-27: qty 2

## 2015-01-27 MED ORDER — PANTOPRAZOLE SODIUM 40 MG PO TBEC
40.0000 mg | DELAYED_RELEASE_TABLET | Freq: Every day | ORAL | Status: DC
  Administered 2015-01-28 – 2015-02-01 (×5): 40 mg via ORAL
  Filled 2015-01-27 (×5): qty 1

## 2015-01-27 MED ORDER — HEPARIN SODIUM (PORCINE) 5000 UNIT/ML IJ SOLN
5000.0000 [IU] | Freq: Three times a day (TID) | INTRAMUSCULAR | Status: DC
  Administered 2015-01-27 – 2015-02-01 (×14): 5000 [IU] via SUBCUTANEOUS
  Filled 2015-01-27 (×17): qty 1

## 2015-01-27 MED ORDER — ONDANSETRON HCL 4 MG PO TABS: 4.0000 mg | ORAL_TABLET | Freq: Four times a day (QID) | ORAL | Status: DC | PRN

## 2015-01-27 MED ORDER — DEXTROSE 5 % IV SOLN
1.0000 g | Freq: Once | INTRAVENOUS | Status: AC
  Administered 2015-01-27: 1 g via INTRAVENOUS
  Filled 2015-01-27: qty 10

## 2015-01-27 MED ORDER — GABAPENTIN 300 MG PO CAPS
300.0000 mg | ORAL_CAPSULE | Freq: Three times a day (TID) | ORAL | Status: DC
  Administered 2015-01-27 – 2015-02-01 (×14): 300 mg via ORAL
  Filled 2015-01-27 (×17): qty 1

## 2015-01-27 MED ORDER — SODIUM CHLORIDE 0.9 % IV BOLUS (SEPSIS)
1000.0000 mL | Freq: Once | INTRAVENOUS | Status: AC
  Administered 2015-01-27: 1000 mL via INTRAVENOUS

## 2015-01-27 MED ORDER — SODIUM CHLORIDE 0.9 % IJ SOLN
3.0000 mL | Freq: Two times a day (BID) | INTRAMUSCULAR | Status: DC
  Administered 2015-01-27 – 2015-02-01 (×8): 3 mL via INTRAVENOUS
  Filled 2015-01-27: qty 3

## 2015-01-27 MED ORDER — INSULIN ASPART 100 UNIT/ML ~~LOC~~ SOLN
0.0000 [IU] | Freq: Every day | SUBCUTANEOUS | Status: DC
  Administered 2015-01-27: 2 [IU] via SUBCUTANEOUS
  Filled 2015-01-27: qty 1

## 2015-01-27 MED ORDER — TRAMADOL HCL 50 MG PO TABS
50.0000 mg | ORAL_TABLET | Freq: Four times a day (QID) | ORAL | Status: DC | PRN
  Administered 2015-01-28 – 2015-02-01 (×5): 50 mg via ORAL
  Filled 2015-01-27 (×5): qty 1

## 2015-01-27 MED ORDER — OCUVITE-LUTEIN PO CAPS
1.0000 | ORAL_CAPSULE | Freq: Two times a day (BID) | ORAL | Status: DC
  Administered 2015-01-27 – 2015-01-31 (×8): 1 via ORAL
  Filled 2015-01-27 (×11): qty 1

## 2015-01-27 MED ORDER — ACETAMINOPHEN 650 MG RE SUPP
650.0000 mg | Freq: Four times a day (QID) | RECTAL | Status: DC | PRN
  Filled 2015-01-27: qty 1

## 2015-01-27 MED ORDER — ISOSORBIDE DINITRATE 30 MG PO TABS
30.0000 mg | ORAL_TABLET | Freq: Every day | ORAL | Status: DC
  Administered 2015-01-28 – 2015-02-01 (×5): 30 mg via ORAL
  Filled 2015-01-27 (×5): qty 1

## 2015-01-27 MED ORDER — DEXTROSE 5 % IV SOLN
1.0000 g | INTRAVENOUS | Status: DC
  Administered 2015-01-28: 1 g via INTRAVENOUS
  Filled 2015-01-27 (×2): qty 10

## 2015-01-27 MED ORDER — ONDANSETRON HCL 4 MG/2ML IJ SOLN
4.0000 mg | Freq: Four times a day (QID) | INTRAMUSCULAR | Status: DC | PRN
  Administered 2015-01-28: 4 mg via INTRAVENOUS
  Filled 2015-01-27: qty 2

## 2015-01-27 MED ORDER — INSULIN DETEMIR 100 UNIT/ML ~~LOC~~ SOLN
20.0000 [IU] | Freq: Every day | SUBCUTANEOUS | Status: DC
  Administered 2015-01-27 – 2015-01-31 (×5): 20 [IU] via SUBCUTANEOUS
  Filled 2015-01-27 (×6): qty 0.2

## 2015-01-27 NOTE — ED Provider Notes (Signed)
I saw and evaluated the patient, reviewed the resident's note and I agree with the findings and plan.   EKG Interpretation   Date/Time:  Thursday January 27 2015 18:57:19 EST Ventricular Rate:  90 PR Interval:  168 QRS Duration: 102 QT Interval:  385 QTC Calculation: 471 R Axis:   97 Text Interpretation:  Sinus rhythm Right axis deviation Borderline low  voltage, extremity leads No significant change since last tracing  Confirmed by Kenly Henckel  MD, Nashid Pellum [phone]([id number]) on 01/27/2015 8:10:17 PM      Results for orders placed or performed during the hospital encounter of 01/27/15  Blood Culture (routine x 2)  Result Value Ref Range   Specimen Description BLOOD RIGHT FOREARM    Special Requests BOTTLES DRAWN AEROBIC AND ANAEROBIC 5CC    Culture PENDING    Report Status PENDING   Blood Culture (routine x 2)  Result Value Ref Range   Specimen Description BLOOD RIGHT HAND    Special Requests BOTTLES DRAWN AEROBIC AND ANAEROBIC 5CC    Culture PENDING    Report Status PENDING   CBC WITH DIFFERENTIAL  Result Value Ref Range   WBC 8.8 4.0 - 10.5 K/uL   RBC 3.93 3.87 - 5.11 MIL/uL   Hemoglobin 13.0 12.0 - 15.0 g/dL   HCT 19.136.1 47.836.0 - 29.546.0 %   MCV 91.9 78.0 - 100.0 fL   MCH 33.1 26.0 - 34.0 pg   MCHC 36.0 30.0 - 36.0 g/dL   RDW 62.112.6 30.811.5 - 65.715.5 %   Platelets 111 (L) 150 - 400 K/uL   Neutrophils Relative % 85 (H) 43 - 77 %   Neutro Abs 7.5 1.7 - 7.7 K/uL   Lymphocytes Relative 5 (L) 12 - 46 %   Lymphs Abs 0.4 (L) 0.7 - 4.0 K/uL   Monocytes Relative 11 3 - 12 %   Monocytes Absolute 0.9 0.1 - 1.0 K/uL   Eosinophils Relative 0 0 - 5 %   Eosinophils Absolute 0.0 0.0 - 0.7 K/uL   Basophils Relative 0 0 - 1 %   Basophils Absolute 0.0 0.0 - 0.1 K/uL  Comprehensive metabolic panel  Result Value Ref Range   Sodium 130 (L) 135 - 145 mmol/L   Potassium 3.9 3.5 - 5.1 mmol/L   Chloride 98 96 - 112 mmol/L   CO2 22 19 - 32 mmol/L   Glucose, Bld 241 (H) 70 - 99 mg/dL   BUN 20 6 - 23 mg/dL   Creatinine, Ser 8.461.53 (H) 0.50 - 1.10 mg/dL   Calcium 8.0 (L) 8.4 - 10.5 mg/dL   Total Protein 5.4 (L) 6.0 - 8.3 g/dL   Albumin 2.6 (L) 3.5 - 5.2 g/dL   AST 28 0 - 37 U/L   ALT 22 0 - 35 U/L   Alkaline Phosphatase 79 39 - 117 U/L   Total Bilirubin 1.3 (H) 0.3 - 1.2 mg/dL   GFR calc non Af Amer 32 (L) >90 mL/min   GFR calc Af Amer 37 (L) >90 mL/min   Anion gap 10 5 - 15  Urinalysis, Routine w reflex microscopic  Result Value Ref Range   Color, Urine ORANGE (A) YELLOW   APPearance CLOUDY (A) CLEAR   Specific Gravity, Urine 1.012 1.005 - 1.030   pH 5.0 5.0 - 8.0   Glucose, UA NEGATIVE NEGATIVE mg/dL   Hgb urine dipstick MODERATE (A) NEGATIVE   Bilirubin Urine NEGATIVE NEGATIVE   Ketones, ur 15 (A) NEGATIVE mg/dL   Protein, ur 30 (A) NEGATIVE  mg/dL   Urobilinogen, UA 1.0 0.0 - 1.0 mg/dL   Nitrite POSITIVE (A) NEGATIVE   Leukocytes, UA LARGE (A) NEGATIVE  Urine microscopic-add on  Result Value Ref Range   Squamous Epithelial / LPF RARE RARE   WBC, UA 3-6 <3 WBC/hpf   RBC / HPF 0-2 <3 RBC/hpf   Bacteria, UA MANY (A) RARE  I-Stat CG4 Lactic Acid, ED (not at [facility])  Result Value Ref Range   Lactic Acid, Venous 1.76 0.5 - 2.0 mmol/L  I-Stat Troponin, ED (not at [facility])  Result Value Ref Range   Troponin i, poc 0.03 0.00 - 0.08 ng/mL   Comment 3          I-stat troponin, ED  Result Value Ref Range   Troponin i, poc 0.04 0.00 - 0.08 ng/mL   Comment 3           Dg Chest Port 1 View  01/27/2015   CLINICAL DATA:  Altered mental status today.  History of COPD.  EXAM: PORTABLE CHEST - 1 VIEW  COMPARISON:  PA and lateral chest 08/22/2014.  CT chest 08/07/2009.  FINDINGS: Heart size is upper normal. Lungs are clear. No pneumothorax or pleural effusion. No focal bony abnormality.  IMPRESSION: No acute disease.   Electronically Signed   By: Drusilla Kanner M.D.   On: 01/27/2015 19:33    Patient presenting with couple days of confusion. Patient was diagnosed urinary tract infection 2 weeks ago  but had no treatment. A patient with fever here 203. Patient with initial concerns for possible septic picture. Lactic acid is not elevated. Patient's blood pressure with an occasional readings below 90. With most recent one was 98 systolic. Patient not tachycardic. Patient started on antibiotics. Patient also requiring hydration. Patient clinically very dry. Urine culture done. Patient followed by [facility] admitted by Dr. Kennedy Bucker. Troponins were negative. As stated the lactic acid was less than 2. Creatinine slightly elevated 1.53. No leukocytosis. No significant anemia. Chest x-ray negative for any acute disease.    Vanetta Mulders, MD 01/27/15 2252

## 2015-01-27 NOTE — H&P (Signed)
Lisa Gardner is an 79 y.o. female.   PCP:   Gaspar Garbe, MD   Chief Complaint:  Confusion, AMS, Urosepsis  HPI: 33 F c poorly controlled DM2 and MMP.  Family reports she is very private and does not share much c them.  They also say she takes care of husband who has mild - mod Dementia.  They also stated she has poor compliance.  She has been getting sick c fever, chills, confusion, weakness, AFTT, DeH, and sig Weakness.  She states she barely got out of bed in the last few days.  She was encouraged to come to the ED by numerous people and refused.  Her husband at some point called ems and brought her here.  She does not remember that.  In ED BP 80's, Temp to 103, WBC only 8.8 but + L Shift.  UA is dirty and I was called for inpt admission for Urosepsis. Treated in ED c IVF and Rocephin    Past Medical History:  Past Medical History  Diagnosis Date  . TIA (transient ischemic attack) 2009 and 2011  . COPD (chronic obstructive pulmonary disease)   . Diabetes mellitus without complication     insulin dependant  . GERD (gastroesophageal reflux disease)   . Diabetic neuropathy   . Neuromuscular disorder     Lumber Four-Five  . Coronary artery disease 2008    BY CATH, NON OBSTRUCTIVE, last nuc 2013 no ischemia EF 57%  . Hyperlipidemia LDL goal < 70     Past Surgical History  Procedure Laterality Date  . Back surgery  1998, 1975    Lumbar  . Joint replacement  Feb. 2011/August 2011    Bilateral Knees   . Abdominal hysterectomy  1975    Partial, still has ovaries.  . Tonsillectomy  1949  . Cardiac catheterization  2008    30% LM, 40% diag, 50% RCA      Allergies:   Allergies  Allergen Reactions  . Garlic Diarrhea  . Kiwi Extract     Tongue breaks out in blisters  . Vitamin E Rash     Medications: Prior to Admission medications   Medication Sig Start Date End Date Taking? Authorizing Provider  Ascorbic Acid (VITAMIN C) 1000 MG tablet Take 1,000 mg by mouth  daily.   Yes Historical Provider, MD  aspirin EC 81 MG tablet Take 81 mg by mouth at bedtime.   Yes Historical Provider, MD  atorvastatin (LIPITOR) 80 MG tablet Take 80 mg by mouth daily.   Yes Historical Provider, MD  calcium-vitamin D (OSCAL WITH D) 250-125 MG-UNIT per tablet Take 2 tablets by mouth daily. 11/04/12  Yes Historical Provider, MD  CINNAMON PO Take 2 tablets by mouth daily.    Yes Historical Provider, MD  DULoxetine (CYMBALTA) 60 MG capsule Take 60 mg by mouth daily.   Yes Historical Provider, MD  ezetimibe (ZETIA) 10 MG tablet Take 10 mg by mouth daily.   Yes Historical Provider, MD  ferrous sulfate 325 (65 FE) MG tablet Take 325 mg by mouth daily with breakfast.   Yes Historical Provider, MD  furosemide (LASIX) 80 MG tablet Take 80-160 mg by mouth daily. Take 1 tablet by mouth alternating with 2 tablets every other day   Yes Historical Provider, MD  gabapentin (NEURONTIN) 300 MG capsule Take 300 mg by mouth 3 (three) times daily.   Yes Historical Provider, MD  glimepiride (AMARYL) 4 MG tablet Take 4 mg by mouth 2 times  daily at 12 noon and 4 pm.   Yes Historical Provider, MD  insulin aspart (NOVOLOG) 100 UNIT/ML injection Inject 25 Units into the skin 3 (three) times daily with meals. 18 units in the am and pm. 14 units at lunch   Yes Historical Provider, MD  insulin detemir (LEVEMIR) 100 UNIT/ML injection Inject 25 Units into the skin daily.    Yes Historical Provider, MD  isosorbide dinitrate (ISORDIL) 30 MG tablet Take 30 mg by mouth daily.  12/07/13  Yes Leone BrandLaura R Ingold, NP  losartan (COZAAR) 50 MG tablet Take 50 mg by mouth daily.  07/12/14  Yes Historical Provider, MD  metFORMIN (GLUCOPHAGE) 1000 MG tablet Take 1,000 mg by mouth 2 (two) times daily with a meal.   Yes Historical Provider, MD  Multiple Vitamins-Minerals (MULTIVITAMIN WITH MINERALS) tablet Take 1 tablet by mouth daily.   Yes Historical Provider, MD  multivitamin-lutein (OCUVITE-LUTEIN) CAPS Take 1 capsule by mouth 2  (two) times daily.    Yes Historical Provider, MD  omeprazole (PRILOSEC) 40 MG capsule Take 40 mg by mouth daily.  07/12/14  Yes Historical Provider, MD  potassium chloride SA (K-DUR,KLOR-CON) 20 MEQ tablet Take 20 mEq by mouth daily.   Yes Historical Provider, MD  traMADol (ULTRAM) 50 MG tablet Take 1 tablet (50 mg total) by mouth every 6 (six) hours as needed. 08/22/14  Yes Elwin MochaBlair Walden, MD      (Not in a hospital admission)   Social History:  reports that she has never smoked. She has never used smokeless tobacco. She reports that she does not drink alcohol or use illicit drugs.  Family History: Family History  Problem Relation Age of Onset  . Kidney disease Mother   . Cancer Father   . Diabetes Brother   . Diabetes Brother   . Heart disease Brother   . Heart attack Brother   . Multiple sclerosis Daughter     Review of Systems:  Review of Systems - See HPI.  Denies N/V/D, CP, SOB, DOE. Full ROS done.  Physical Exam:  Blood pressure 98/47, pulse 81, temperature 101.8 F (38.8 C), temperature source Rectal, resp. rate 22, height 5\' 4"  (1.626 m), weight 77.111 kg (170 lb), SpO2 93 %. Filed Vitals:   01/27/15 2100 01/27/15 2105 01/27/15 2105 01/27/15 2115  BP: 99/46   98/47  Pulse: 81   81  Temp:  101.8 F (38.8 C)    TempSrc:  Rectal    Resp: 20   22  Height:   5\' 4"  (1.626 m)   Weight:   77.111 kg (170 lb)   SpO2: 96%   93%   General appearance: Alopecia.  Mouth dry.  Communicating fine Head: Normocephalic, without obvious abnormality, atraumatic Eyes: conjunctivae/corneas clear. PERRL, EOM's intact.  Nose: Nares normal. Septum midline. Mucosa normal. No drainage or sinus tenderness. Mouth: Dry Neck: no adenopathy, no carotid bruit, no JVD and thyroid not enlarged, symmetric, no tenderness/mass/nodules Resp: CTA Cardio: Reg GI: Obese, soft, non-tender; bowel sounds normal; no masses,  no organomegaly Extremities: extremities normal, atraumatic, no cyanosis or  edema Pulses: 2+ and symmetric Lymph nodes: no cervical lymphadenopathy Neurologic: Alert and oriented, gen Weakness, Nothing Focal.    Labs on Admission:   Recent Labs  01/27/15 1930  NA 130*  K 3.9  CL 98  CO2 22  GLUCOSE 241*  BUN 20  CREATININE 1.53*  CALCIUM 8.0*    Recent Labs  01/27/15 1930  AST 28  ALT 22  ALKPHOS  79  BILITOT 1.3*  PROT 5.4*  ALBUMIN 2.6*   No results for input(s): LIPASE, AMYLASE in the last 72 hours.  Recent Labs  01/27/15 1930  WBC 8.8  NEUTROABS 7.5  HGB 13.0  HCT 36.1  MCV 91.9  PLT 111*   No results for input(s): CKTOTAL, CKMB, CKMBINDEX, TROPONINI in the last 72 hours. Lab Results  Component Value Date   INR 2.3* 08/10/2009   INR 2.2* 08/09/2009   INR 2.0* 08/08/2009     LAB RESULT POCT:  Results for orders placed or performed during the hospital encounter of 01/27/15  Blood Culture (routine x 2)  Result Value Ref Range   Specimen Description BLOOD RIGHT FOREARM    Special Requests BOTTLES DRAWN AEROBIC AND ANAEROBIC 5CC    Culture PENDING    Report Status PENDING   Blood Culture (routine x 2)  Result Value Ref Range   Specimen Description BLOOD RIGHT HAND    Special Requests BOTTLES DRAWN AEROBIC AND ANAEROBIC 5CC    Culture PENDING    Report Status PENDING   CBC WITH DIFFERENTIAL  Result Value Ref Range   WBC 8.8 4.0 - 10.5 K/uL   RBC 3.93 3.87 - 5.11 MIL/uL   Hemoglobin 13.0 12.0 - 15.0 g/dL   HCT 40.9 81.1 - 91.4 %   MCV 91.9 78.0 - 100.0 fL   MCH 33.1 26.0 - 34.0 pg   MCHC 36.0 30.0 - 36.0 g/dL   RDW 78.2 95.6 - 21.3 %   Platelets 111 (L) 150 - 400 K/uL   Neutrophils Relative % 85 (H) 43 - 77 %   Neutro Abs 7.5 1.7 - 7.7 K/uL   Lymphocytes Relative 5 (L) 12 - 46 %   Lymphs Abs 0.4 (L) 0.7 - 4.0 K/uL   Monocytes Relative 11 3 - 12 %   Monocytes Absolute 0.9 0.1 - 1.0 K/uL   Eosinophils Relative 0 0 - 5 %   Eosinophils Absolute 0.0 0.0 - 0.7 K/uL   Basophils Relative 0 0 - 1 %   Basophils  Absolute 0.0 0.0 - 0.1 K/uL  Comprehensive metabolic panel  Result Value Ref Range   Sodium 130 (L) 135 - 145 mmol/L   Potassium 3.9 3.5 - 5.1 mmol/L   Chloride 98 96 - 112 mmol/L   CO2 22 19 - 32 mmol/L   Glucose, Bld 241 (H) 70 - 99 mg/dL   BUN 20 6 - 23 mg/dL   Creatinine, Ser 0.86 (H) 0.50 - 1.10 mg/dL   Calcium 8.0 (L) 8.4 - 10.5 mg/dL   Total Protein 5.4 (L) 6.0 - 8.3 g/dL   Albumin 2.6 (L) 3.5 - 5.2 g/dL   AST 28 0 - 37 U/L   ALT 22 0 - 35 U/L   Alkaline Phosphatase 79 39 - 117 U/L   Total Bilirubin 1.3 (H) 0.3 - 1.2 mg/dL   GFR calc non Af Amer 32 (L) >90 mL/min   GFR calc Af Amer 37 (L) >90 mL/min   Anion gap 10 5 - 15  Urinalysis, Routine w reflex microscopic  Result Value Ref Range   Color, Urine ORANGE (A) YELLOW   APPearance CLOUDY (A) CLEAR   Specific Gravity, Urine 1.012 1.005 - 1.030   pH 5.0 5.0 - 8.0   Glucose, UA NEGATIVE NEGATIVE mg/dL   Hgb urine dipstick MODERATE (A) NEGATIVE   Bilirubin Urine NEGATIVE NEGATIVE   Ketones, ur 15 (A) NEGATIVE mg/dL   Protein, ur 30 (A) NEGATIVE  mg/dL   Urobilinogen, UA 1.0 0.0 - 1.0 mg/dL   Nitrite POSITIVE (A) NEGATIVE   Leukocytes, UA LARGE (A) NEGATIVE  Urine microscopic-add on  Result Value Ref Range   Squamous Epithelial / LPF RARE RARE   WBC, UA 3-6 <3 WBC/hpf   RBC / HPF 0-2 <3 RBC/hpf   Bacteria, UA MANY (A) RARE  I-Stat CG4 Lactic Acid, ED (not at [facility])  Result Value Ref Range   Lactic Acid, Venous 1.76 0.5 - 2.0 mmol/L  I-Stat Troponin, ED (not at [facility])  Result Value Ref Range   Troponin i, poc 0.03 0.00 - 0.08 ng/mL   Comment 3          I-stat troponin, ED  Result Value Ref Range   Troponin i, poc 0.04 0.00 - 0.08 ng/mL   Comment 3              Radiological Exams on Admission: Dg Chest Port 1 View  01/27/2015   CLINICAL DATA:  Altered mental status today.  History of COPD.  EXAM: PORTABLE CHEST - 1 VIEW  COMPARISON:  PA and lateral chest 08/22/2014.  CT chest 08/07/2009.  FINDINGS: Heart size is  upper normal. Lungs are clear. No pneumothorax or pleural effusion. No focal bony abnormality.  IMPRESSION: No acute disease.   Electronically Signed   By: Drusilla Kanner M.D.   On: 01/27/2015 19:33      Orders placed or performed during the hospital encounter of 01/27/15  . EKG 12-Lead  . EKG 12-Lead  . ED EKG  . ED EKG     Assessment/Plan Active Problems:   CAD (coronary artery disease)   Diabetes   Sepsis due to urinary tract infection  Urosepsis - Await Cx. Rocephin.  IVF.  Blood Cx. Poorly controlled DM2 - She states she takes Metformin/Amaryl and @ 25 units short acting 3 times daily and no long acting insulin despite levimir being on current list  Hold Metformin c Cr>1.5.  Lantus c SSI in house. COPD CAD - No Angina. Lipids - on lipitor. Thrombocytopenia - recheck in am Depression - Cymbalta DeH - Hold Lasix. CKDz c Cr up to 1.52 - follow Hyponatremia - IVF and follow. DVT Proph Obesity but Prot Cal Malnutrition as seen c Albumin 2.6   Eleri Ruben M 01/27/2015, 10:23 PM

## 2015-01-27 NOTE — ED Notes (Signed)
MD at bedside. 

## 2015-01-27 NOTE — ED Notes (Signed)
[facility]- EMS called out for confusion X2-3 days. Pt was dx with UTI 2 weeks ago but had no treatment. Warm to touch. Alert and oriented to person and time intermittently.

## 2015-01-27 NOTE — ED Notes (Signed)
Lactic Acid called to Dr.Zackowski

## 2015-01-27 NOTE — ED Provider Notes (Signed)
CSN: [id number]     Arrival date & time 01/27/15  1856 History   First MD Initiated Contact with Patient 01/27/15 1905     Chief Complaint  Patient presents with  . Fatigue  . Altered Mental Status     (Consider location/radiation/quality/duration/timing/severity/associated sxs/prior Treatment) HPI Comments: 79 yo F with PMHx of TIA, COPD, DM2, CAD who p/w a several day h/o dysuria, urinary frequency, and a 2-day h/o fever up to 103 at home, with poor PO intake and increased confusion throughout the day today.  Patient is a 79 y.o. female presenting with general illness. The history is provided by the patient, the spouse and a friend.  Illness Location:  Generalized Quality:  Fever, confusion Severity:  Severe Onset quality:  Gradual Duration:  3 days Timing:  Constant Progression:  Worsening Chronicity:  New Context:  Patient recently completed an outpatietn course of ABX for UTI per spouse's report, but sx returned this week Relieved by:  Nothing tried Worsened by:  Nothing in particular Ineffective treatments:  None tried Associated symptoms: fatigue, fever, headaches and nausea   Associated symptoms: no abdominal pain, no chest pain, no congestion, no cough, no diarrhea, no rash, no shortness of breath, no sore throat, no vomiting and no wheezing     Past Medical History  Diagnosis Date  . TIA (transient ischemic attack) 2009 and 2011  . COPD (chronic obstructive pulmonary disease)   . Diabetes mellitus without complication     insulin dependant  . GERD (gastroesophageal reflux disease)   . Diabetic neuropathy   . Neuromuscular disorder     Lumber Four-Five  . Coronary artery disease 2008    BY CATH, NON OBSTRUCTIVE, last nuc 2013 no ischemia EF 57%  . Hyperlipidemia LDL goal < 70    Past Surgical History  Procedure Laterality Date  . Back surgery  1998, 1975    Lumbar  . Joint replacement  Feb. 2011/August 2011    Bilateral Knees   . Abdominal hysterectomy   1975    Partial, still has ovaries.  . Tonsillectomy  1949  . Cardiac catheterization  2008    30% LM, 40% diag, 50% RCA   Family History  Problem Relation Age of Onset  . Kidney disease Mother   . Cancer Father   . Diabetes Brother   . Diabetes Brother   . Heart disease Brother   . Heart attack Brother   . Multiple sclerosis Daughter    History  Substance Use Topics  . Smoking status: Never Smoker   . Smokeless tobacco: Never Used  . Alcohol Use: No   OB History    No data available     Review of Systems  Constitutional: Positive for fever and fatigue. Negative for chills.  HENT: Negative for congestion and sore throat.   Eyes: Negative for photophobia and visual disturbance.  Respiratory: Negative for cough, shortness of breath and wheezing.   Cardiovascular: Negative for chest pain.  Gastrointestinal: Positive for nausea. Negative for vomiting, abdominal pain and diarrhea.  Genitourinary: Positive for dysuria and frequency. Negative for vaginal bleeding and vaginal discharge.  Musculoskeletal: Negative for neck pain.  Skin: Negative for rash.  Allergic/Immunologic: Negative for immunocompromised state.  Neurological: Positive for weakness and headaches. Negative for dizziness, syncope and light-headedness.  Psychiatric/Behavioral: Positive for confusion.      Allergies  Garlic; Kiwi extract; and Vitamin e  Home Medications   Prior to Admission medications   Medication Sig Start Date End Date  Taking? Authorizing Provider  Ascorbic Acid (VITAMIN C) 1000 MG tablet Take 1,000 mg by mouth daily.   Yes Historical Provider, MD  aspirin EC 81 MG tablet Take 81 mg by mouth at bedtime.   Yes Historical Provider, MD  atorvastatin (LIPITOR) 80 MG tablet Take 80 mg by mouth daily.   Yes Historical Provider, MD  calcium-vitamin D (OSCAL WITH D) 250-125 MG-UNIT per tablet Take 2 tablets by mouth daily. 11/04/12  Yes Historical Provider, MD  CINNAMON PO Take 2 tablets by  mouth daily.    Yes Historical Provider, MD  DULoxetine (CYMBALTA) 60 MG capsule Take 60 mg by mouth daily.   Yes Historical Provider, MD  ezetimibe (ZETIA) 10 MG tablet Take 10 mg by mouth daily.   Yes Historical Provider, MD  ferrous sulfate 325 (65 FE) MG tablet Take 325 mg by mouth daily with breakfast.   Yes Historical Provider, MD  furosemide (LASIX) 80 MG tablet Take 80-160 mg by mouth daily. Take 1 tablet by mouth alternating with 2 tablets every other day   Yes Historical Provider, MD  gabapentin (NEURONTIN) 300 MG capsule Take 300 mg by mouth 3 (three) times daily.   Yes Historical Provider, MD  glimepiride (AMARYL) 4 MG tablet Take 4 mg by mouth 2 times daily at 12 noon and 4 pm.   Yes Historical Provider, MD  insulin aspart (NOVOLOG) 100 UNIT/ML injection Inject 25 Units into the skin 3 (three) times daily with meals. 18 units in the am and pm. 14 units at lunch   Yes Historical Provider, MD  insulin detemir (LEVEMIR) 100 UNIT/ML injection Inject 25 Units into the skin daily.    Yes Historical Provider, MD  isosorbide dinitrate (ISORDIL) 30 MG tablet Take 30 mg by mouth daily.  12/07/13  Yes Leone Brand, NP  losartan (COZAAR) 50 MG tablet Take 50 mg by mouth daily.  07/12/14  Yes Historical Provider, MD  metFORMIN (GLUCOPHAGE) 1000 MG tablet Take 1,000 mg by mouth 2 (two) times daily with a meal.   Yes Historical Provider, MD  Multiple Vitamins-Minerals (MULTIVITAMIN WITH MINERALS) tablet Take 1 tablet by mouth daily.   Yes Historical Provider, MD  multivitamin-lutein (OCUVITE-LUTEIN) CAPS Take 1 capsule by mouth 2 (two) times daily.    Yes Historical Provider, MD  omeprazole (PRILOSEC) 40 MG capsule Take 40 mg by mouth daily.  07/12/14  Yes Historical Provider, MD  potassium chloride SA (K-DUR,KLOR-CON) 20 MEQ tablet Take 20 mEq by mouth daily.   Yes Historical Provider, MD  traMADol (ULTRAM) 50 MG tablet Take 1 tablet (50 mg total) by mouth every 6 (six) hours as needed. 08/22/14  Yes  Elwin Mocha, MD   BP 96/42 mmHg  Pulse 70  Temp(Src) 98 F (36.7 C) (Oral)  Resp 18  Ht  (1.651 m)  Wt 176 lb 12.9 oz (80.2 kg)  BMI 29.42 kg/m2  SpO2 97% Physical Exam  Constitutional: She is oriented to person, place, and time. She appears well-developed and well-nourished. No distress.  HENT:  Head: Normocephalic and atraumatic.  Dry mucous membranes  Eyes: Pupils are equal, round, and reactive to light.  Neck: Normal range of motion. Neck supple.  No stiffness or rigidity  Cardiovascular: Normal rate and normal heart sounds.   Pulmonary/Chest: Effort normal and breath sounds normal. No respiratory distress. She has no wheezes. She has no rales.  Abdominal: Soft. She exhibits no distension. There is no tenderness. There is no rebound and no guarding.  Musculoskeletal:  She exhibits no edema.  Neurological: She is alert and oriented to person, place, and time. She has normal strength. No cranial nerve deficit or sensory deficit. She exhibits normal muscle tone. GCS eye subscore is 4. GCS verbal subscore is 5. GCS motor subscore is 6.  Skin: Skin is warm. No rash noted.  Nursing note and vitals reviewed.   ED Course  Procedures (including critical care time) Labs Review Labs Reviewed  CBC WITH DIFFERENTIAL/PLATELET - Abnormal; Notable for the following:    Platelets 111 (*)    Neutrophils Relative % 85 (*)    Lymphocytes Relative 5 (*)    Lymphs Abs 0.4 (*)    All other components within normal limits  COMPREHENSIVE METABOLIC PANEL - Abnormal; Notable for the following:    Sodium 130 (*)    Glucose, Bld 241 (*)    Creatinine, Ser 1.53 (*)    Calcium 8.0 (*)    Total Protein 5.4 (*)    Albumin 2.6 (*)    Total Bilirubin 1.3 (*)    GFR calc non Af Amer 32 (*)    GFR calc Af Amer 37 (*)    All other components within normal limits  URINALYSIS, ROUTINE W REFLEX MICROSCOPIC - Abnormal; Notable for the following:    Color, Urine ORANGE (*)    APPearance CLOUDY (*)     Hgb urine dipstick MODERATE (*)    Ketones, ur 15 (*)    Protein, ur 30 (*)    Nitrite POSITIVE (*)    Leukocytes, UA LARGE (*)    All other components within normal limits  URINE MICROSCOPIC-ADD ON - Abnormal; Notable for the following:    Bacteria, UA MANY (*)    All other components within normal limits  GLUCOSE, CAPILLARY - Abnormal; Notable for the following:    Glucose-Capillary 208 (*)    All other components within normal limits  CBG MONITORING, ED - Abnormal; Notable for the following:    Glucose-Capillary 250 (*)    All other components within normal limits  CULTURE, BLOOD (ROUTINE X 2)  CULTURE, BLOOD (ROUTINE X 2)  URINE CULTURE  TSH  HEMOGLOBIN A1C  BASIC METABOLIC PANEL  CBC  I-STAT CG4 LACTIC ACID, ED  I-STAT TROPOININ, ED  I-STAT CG4 LACTIC ACID, ED  Rosezena Sensor, ED    Imaging Review Dg Chest Port 1 View  01/27/2015   CLINICAL DATA:  Altered mental status today.  History of COPD.  EXAM: PORTABLE CHEST - 1 VIEW  COMPARISON:  PA and lateral chest 08/22/2014.  CT chest 08/07/2009.  FINDINGS: Heart size is upper normal. Lungs are clear. No pneumothorax or pleural effusion. No focal bony abnormality.  IMPRESSION: No acute disease.   Electronically Signed   By: Drusilla Kanner M.D.   On: 01/27/2015 19:33     EKG Interpretation   Date/Time:  Thursday January 27 2015 18:57:19 EST Ventricular Rate:  90 PR Interval:  168 QRS Duration: 102 QT Interval:  385 QTC Calculation: 471 R Axis:   97 Text Interpretation:  Sinus rhythm Right axis deviation Borderline low  voltage, extremity leads No significant change since last tracing  Confirmed by ZACKOWSKI  MD, SCOTT [phone]) on 01/27/2015 8:10:17 PM      MDM   79 yo F with PMHx of TIA, COPD, DM2, CAD who p/w a several day h/o dysuria, urinary frequency, and a 2-day h/o fever up to 103 at home, with poor PO intake and increased confusion throughout the day today.  See HPI above. On arrival, T 103F, HR 88, RR  20, BP 111/47, satting 97% on RA. Exam as above, pt appears generally tired but with no focal neuro deficits, abdomen soft and NT, ND.  Pt's presentation is most c/w UTI with subsequent encephalopathy. However, will send CXR to eval for PNA and broad labs to assess for alternative etiologies such as metabolic encephalopathy 2/2 electrolyte abnormality, renal failure, etc. Pt o/w HDS at this time, will start aggressive fluids while pursuing source. BCx, UCx drawn. Pt does c/o HA but it is mild only and likely 2/2 fever, dehydration with dry MM. Will give tylenol and re-assess.  Pt BP now 90s systolic, but 30-40 diastolic. Will subsequently begin empiric coverage with Rocephin IV while awaiting lab results. Pt mentation improved with IVF and HA resolved. Awaiting labs.  CBC with no leukocytosis or anemia. CMP with Na 130, Cr 1.53, up from baseline <1 c/w dehydration. No acidosis and no AG. Lactate 1.76, reassuring. Troponin neg. UA with POS nitrites, 3-6 WBCs, and MANY bacteria c/f sepsis 2/2 UTI. Rocephin hsa been given. BP improving with IVF. Will admit to [facility]). Pt in agreement.  [place] has evaluated and will admit. VSS. 3L NS given, ABX as well.   Clinical Impression: 1. Sepsis due to urinary tract infection   2. Confusion   3. AKI (acute kidney injury)   4. Dehydration   5. Hyponatremia     Disposition: Admit  Condition: Stable  Pt seen in conjunction with Dr. Aubery Lapping, MD 01/28/15 [phone]

## 2015-01-28 LAB — GLUCOSE, CAPILLARY
GLUCOSE-CAPILLARY: 128 mg/dL — AB (ref 70–99)
GLUCOSE-CAPILLARY: 208 mg/dL — AB (ref 70–99)
Glucose-Capillary: 164 mg/dL — ABNORMAL HIGH (ref 70–99)
Glucose-Capillary: 138 mg/dL — ABNORMAL HIGH (ref 70–99)
Glucose-Capillary: 139 mg/dL — ABNORMAL HIGH (ref 70–99)

## 2015-01-28 LAB — CBC
HEMATOCRIT: 34.4 % — AB (ref 36.0–46.0)
HEMOGLOBIN: 11.8 g/dL — AB (ref 12.0–15.0)
MCH: 31.8 pg (ref 26.0–34.0)
MCHC: 34.3 g/dL (ref 30.0–36.0)
MCV: 92.7 fL (ref 78.0–100.0)
Platelets: 102 10*3/uL — ABNORMAL LOW (ref 150–400)
RBC: 3.71 MIL/uL — ABNORMAL LOW (ref 3.87–5.11)
RDW: 12.8 % (ref 11.5–15.5)
WBC: 7.7 10*3/uL (ref 4.0–10.5)

## 2015-01-28 LAB — BASIC METABOLIC PANEL
ANION GAP: 6 (ref 5–15)
BUN: 17 mg/dL (ref 6–23)
CALCIUM: 7.3 mg/dL — AB (ref 8.4–10.5)
CO2: 24 mmol/L (ref 19–32)
CREATININE: 1.46 mg/dL — AB (ref 0.50–1.10)
Chloride: 104 mmol/L (ref 96–112)
GFR calc Af Amer: 39 mL/min — ABNORMAL LOW (ref >90)
GFR calc non Af Amer: 34 mL/min — ABNORMAL LOW (ref >90)
Glucose, Bld: 172 mg/dL — ABNORMAL HIGH (ref 70–99)
POTASSIUM: 3.4 mmol/L — AB (ref 3.5–5.1)
Sodium: 134 mmol/L — ABNORMAL LOW (ref 135–145)

## 2015-01-28 LAB — TSH: TSH: 0.837 u[IU]/mL (ref 0.350–4.500)

## 2015-01-28 MED ORDER — SODIUM CHLORIDE 0.9 % IV SOLN
INTRAVENOUS | Status: DC
  Administered 2015-01-28: 05:00:00 via INTRAVENOUS

## 2015-01-28 MED ORDER — SODIUM CHLORIDE 0.9 % IV SOLN
INTRAVENOUS | Status: DC
  Administered 2015-01-28 – 2015-01-29 (×2): via INTRAVENOUS

## 2015-01-28 MED ORDER — ACETAMINOPHEN 325 MG PO TABS
650.0000 mg | ORAL_TABLET | Freq: Once | ORAL | Status: AC
  Administered 2015-01-28: 650 mg via ORAL
  Filled 2015-01-28: qty 2

## 2015-01-28 MED ORDER — POTASSIUM CHLORIDE CRYS ER 20 MEQ PO TBCR
40.0000 meq | EXTENDED_RELEASE_TABLET | Freq: Once | ORAL | Status: AC
  Administered 2015-01-28: 40 meq via ORAL
  Filled 2015-01-28: qty 2

## 2015-01-28 NOTE — Progress Notes (Signed)
NUTRITION BRIEF-NOTE  RD consulted for low albumin  Wt Readings from Last 10 Encounters:  01/28/15 176 lb 12.9 oz (80.2 kg)  03/25/14 170 lb (77.111 kg)  01/06/14 174 lb 1.6 oz (78.971 kg)  12/09/13 172 lb (78.019 kg)  12/07/13 172 lb 3.2 oz (78.109 kg)   Body mass index is 29.42 kg/(m^2). Pt meets criteria for Overweight based on current BMI.  Current diet order is Carb Mod, patient is consuming approximately >50% of meals at this time. She reports good appetite, and denies nausea, vomiting, diarrhea or abdominal pain. Labs- high CBGs, glucose, creatinine; low calcium, potassium Medications reviewed.  Pt denied any changes in appetite pta, and can feed self. Pt reported wanting to lose wt to better manage DM. She would like to lose 20-50 lb after hospital stay.   Previous medical records indicate stable wt for past year.   Pt tolerating current diet without any swallowing/chewing difficulties.   No nutrition interventions warranted at this time. If nutrition issues arise, please consult RD.  Cristela FeltElisa Keelyn Monjaras, MS Dietetic Intern Pager: [phone]

## 2015-01-28 NOTE — Evaluation (Signed)
Physical Therapy Evaluation Patient Details Name: Lisa Gardner MRN: [id number] DOB: 04-01-1936 Today's Date: 01/28/2015   History of Present Illness  Patient is a 79 y.o. female admitted with CBG 300 and urosepsis.  PMH positive for TIA, COPD, DM, GERD, HLD and back surgery x 3.  Clinical Impression  Patient presents with decreased independence with mobility due to deficits listed in PT problem list.  She will benefit from skilled PT in the acute setting to allow return home to independent following STSNF rehab stay.  Patient cared for her spouse at home who has dementia.  Would need to be completely independent and may need assist for care for him while she is rehabilitating.    Follow Up Recommendations SNF;Supervision/Assistance - 24 hour    Equipment Recommendations  None recommended by PT    Recommendations for Other Services       Precautions / Restrictions Precautions Precautions: Fall Precaution Comments: previous fall in Sept, had several LOB during ambualtion with walker      Mobility  Bed Mobility Overal bed mobility: Needs Assistance Bed Mobility: Sit to Supine       Sit to supine: Mod assist   General bed mobility comments: assisted each leg into bed; tilted bed into trendelenberg and provided assist as pt pulled on rails to scoot to head of bed  Transfers Overall transfer level: Needs assistance Equipment used: Rolling walker (2 wheeled) Transfers: Sit to/from [employer] Sit to Stand: Mod assist Stand pivot transfers: Min assist       General transfer comment: lifting assist and increased time with cues for hand placement (verbal and demo); from [facility] cues and assist for UE placement onto arms of 3:1 and steadying help with increased time to pivot to bed  Ambulation/Gait Ambulation/Gait assistance: Min assist Ambulation Distance (Feet): 40 Feet Assistive device: Rolling walker (2 wheeled) Gait Pattern/deviations: Step-through  pattern;Decreased stride length;Wide base of support;Shuffle;Trunk flexed     General Gait Details: increased time to walk to door and back; multiple imbalances with forward weight shift onto left foot min/mod assist to recover safely with walker  Stairs            Wheelchair Mobility    Modified Rankin (Stroke Patients Only)       Balance Overall balance assessment: Needs assistance Sitting-balance support: Feet supported Sitting balance-Leahy Scale: Fair     Standing balance support: Bilateral upper extremity supported Standing balance-Leahy Scale: Poor Standing balance comment: needs UE support for pivot to bed from [facility] and stands/walks with walker support                             Pertinent Vitals/Pain Pain Assessment: 0-10 Pain Score: 7  Pain Location: back Pain Intervention(s): Monitored during session;Other (comment);Repositioned (had meds prior to session)    Home Living Family/patient expects to be discharged to:: Skilled nursing facility Living Arrangements: Spouse/significant other   Type of Home: House Home Access: Stairs to enter Entrance Stairs-Rails: Right;Left   Home Layout: Two level;Laundry or work area in [employer] Equipment: Environmental consultant - 4 wheels;Grab bars - tub/shower;Grab bars - toilet Additional Comments: reports has modified bathroom to be handicapped accessible    Prior Function Level of Independence: Independent         Comments: 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, but reports was not driving     Hand Dominance        Extremity/Trunk Assessment  Lower Extremity Assessment: RLE deficits/detail;LLE deficits/detail RLE Deficits / Details: AROM limited due to weakness AAROM WFL; strength hip flexion 3-/5, knee extension 4-/5, ankle DF at least 3/5, but unable to test due to sensitivity in LE's LLE Deficits / Details: AROM limited due to weakness AAROM WFL; strength hip flexion 3-/5, knee  extension 4-/5, ankle DF at least 3/5, but unable to test due to sensitivity in LE's  Cervical / Trunk Assessment: Kyphotic  Communication   Communication: No difficulties  Cognition Arousal/Alertness: Awake/alert Behavior During Therapy: WFL for tasks assessed/performed Overall Cognitive Status: Impaired/Different from baseline Area of Impairment: Orientation Orientation Level: Time             General Comments: reports unable to recall what day it is or how she got here.  Seems able to recall reason for admission    General Comments General comments (skin integrity, edema, etc.): Observed feet with no issues, but pt reports sometimes hypersensitive and sometimes insensate; wears diabetic shoes at home.    Exercises        Assessment/Plan    PT Assessment Patient needs continued PT services  PT Diagnosis Abnormality of gait;Generalized weakness   PT Problem List Decreased strength;Decreased safety awareness;Decreased activity tolerance;Decreased balance;Pain;Decreased mobility;Decreased coordination;Impaired sensation  PT Treatment Interventions DME instruction;Gait training;Balance training;Functional mobility training;Patient/family education;Stair training;Therapeutic activities;Therapeutic exercise   PT Goals (Current goals can be found in the Care Plan section) Acute Rehab PT Goals Patient Stated Goal: To return to independent PT Goal Formulation: With patient Time For Goal Achievement: 02/11/15 Potential to Achieve Goals: Good    Frequency Min 3X/week   Barriers to discharge Decreased caregiver support pt cares for spouse at home    Co-evaluation               End of Session Equipment Utilized During Treatment: Gait belt Activity Tolerance: Patient limited by fatigue Patient left: in bed;with call bell/phone within reach;with bed alarm set           Time: 0902-0939 PT Time Calculation (min) (ACUTE ONLY): 37 min   Charges:   PT  Evaluation $Initial PT Evaluation Tier I: 1 Procedure PT Treatments $Gait Training: 8-22 mins $Therapeutic Activity: 8-22 mins   PT G Codes:        Lataisha Colan,CYNDI 01/28/2015, 9:53 AM  Sheran Lawlessyndi Encarnacion Bole, PT [phone] 01/28/2015

## 2015-01-28 NOTE — Clinical Social Work Placement (Addendum)
    Clinical Social Work Department CLINICAL SOCIAL WORK PLACEMENT NOTE 02/01/2015  Patient:  [name],Lisa R  Account Number:  [id number] Admit date:  01/27/2015  Clinical Social Worker:  Lupita LeashNNA Keegen Heffern, LCSW  Date/time:  01/28/2015 04:45 PM  Clinical Social Work is seeking post-discharge placement for this patient at the following level of care:   SKILLED NURSING   (*CSW will update this form in Epic as items are completed)   01/27/2015  Patient/family provided with [facility] Department of Clinical Social Work's list of facilities offering this level of care within the geographic area requested by the patient (or if unable, by the patient's family).  01/27/2015  Patient/family informed of their freedom to choose among providers that offer the needed level of care, that participate in Medicare, Medicaid or managed care program needed by the patient, have an available bed and are willing to accept the patient.  01/27/2015  Patient/family informed of [facility]' ownership interest in [facility], as well as of the fact that they are under no obligation to receive care at this facility.  PASARR submitted to EDS on 01/27/2015 PASARR number received on 01/27/2015  FL2 transmitted to all facilities in geographic area requested by pt/family on  01/27/2015 FL2 transmitted to all facilities within larger geographic area on   Patient informed that his/her managed care company has contracts with or will negotiate with  certain facilities, including the following:   [facility]-- Medicare     Patient/family informed of bed offers received:  02/01/2015 [facility] Physician recommends and patient chooses bed at    [facility] on  02/01/2015 Patient to be transferred to facility by Ambulance  [facility]) Patient and family notified of transfer on 02/01/2015 Name of family member notified:   [name]- Husband  The following physician request were entered in Epic: Physician Request  Please prepare priority discharge summary and prescriptions.  Please sign FL2.    Additional Comments: 02/01/15  Ok per MD for [facility]. Patient is hopeful to get better to go home. "I miss my house and I want to get better."  Patient spoke of her feelings of loss due to her recent health changes. Her husband is very supportive and provided a great deal of encouragement.  Nursing notified to call report. CSW signing off.  Lorri Frederickonna T. Mellody Masri, LCSW  [phone]

## 2015-01-28 NOTE — Progress Notes (Signed)
CSW met with patient this afternoon to discuss PT's recommendation for short term SNF.  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  She states that she cooks their meals, provides his medications and assists with his ADLS. They had  2 children- their daughter died 1 year ago due to [place]; their son lives in [place] and she does not know his phone number.  Patient states that they have a neighbor- Johnathan who checks on them 1 time a week and assists with getting groceries, meds etc.  Neither patient nor her husband have driven since [id number].  Patient gave CSW the number for Johnathan but the number was incorrect.  She stated "I'm sorry- I don't feel good and my thoughts are scattered."  She stated that no one is at home to help her husband and she does not know what to do.  CSW called the [facility] Department and requested a "Wellness Check" to see if Mr. Terriquez was ok.  A referral was made to the [facility] - spoke to Ms. Beverely Low. She stated that she would have the team review the referral but was unsure if they would be able to substantiate a need to open the case.CSW verbalized concerns that due to his dementia and requiring full support from his wife- that he would not be safe alone at home.  CSW later received a call from [facility] stating that they checked on Mr. Zunker and he was found to be "OK".  SNF bed search discussed and initated.  Full Psychosocial Evaluation will follow.  Lorie Phenix. Pauline Good, [place]

## 2015-01-28 NOTE — Progress Notes (Signed)
Subjective: Windell Moulding was admitted last night after finally getting medical attention.  Her husband contacted our office late Wed afternoon.  She was advised to go to the ER due to concerns of AMS that he called with.  At that point, her sugar was again in the 300 range.  She has a history of noncompliance with her diabetes regimen as well as year end "rationing" of insulin to save money, which accounted for poor control.  Her and her husband also see our on-site CDE, but she is poor at following directions.  She also did not come in for a visit yesterday as requested, instead presenting in the ER later in the evening.   This AM she just feels a bit tired.  No family at bedside at this point.  Objective: Vital signs in last 24 hours: Temp:  [98 F (36.7 C)-103 F (39.4 C)] 100.2 F (37.9 C) (02/19 0652) Pulse Rate:  [70-101] 81 (02/19 0445) Resp:  [16-23] 20 (02/19 0300) BP: (86-115)/(31-74) 107/74 mmHg (02/19 0445) SpO2:  [92 %-100 %] 100 % (02/19 0300) FiO2 (%):  [0 %] 0 % (02/18 2247) Weight:  [77.111 kg (170 lb)-80.2 kg (176 lb 12.9 oz)] 80.2 kg (176 lb 12.9 oz) (02/19 0300) Weight change:     Intake/Output from previous day: 02/18 0701 - 02/19 0700 In: 3860 [P.O.:660; I.V.:100; IV Piggyback:3100] Out: 475 [Urine:475] Intake/Output this shift:    General appearance: alert, cooperative and appears stated age Nose: Nares normal. Septum midline. Mucosa normal. No drainage or sinus tenderness. Throat: lips, mucosa, and tongue normal; teeth and gums normal Neck: no adenopathy, no carotid bruit, no JVD, supple, symmetrical, trachea midline and thyroid not enlarged, symmetric, no tenderness/mass/nodules Resp: clear to auscultation bilaterally Cardio: regular rate and rhythm, S1, S2 normal, no murmur, click, rub or gallop GI: soft, non-tender; bowel sounds normal; no masses,  no organomegaly Extremities: extremities normal, atraumatic, no cyanosis or edema Skin: Skin color, texture, turgor  normal. No rashes or lesions Neurologic: Grossly normal  Lab Results:  Recent Labs  01/27/15 1930 01/28/15 0454  WBC 8.8 7.7  HGB 13.0 11.8*  HCT 36.1 34.4*  PLT 111* 102*   BMET  Recent Labs  01/27/15 1930 01/28/15 0454  NA 130* 134*  K 3.9 3.4*  CL 98 104  CO2 22 24  GLUCOSE 241* 172*  BUN 20 17  CREATININE 1.53* 1.46*  CALCIUM 8.0* 7.3*    Studies/Results: Dg Chest Port 1 View  01/27/2015   CLINICAL DATA:  Altered mental status today.  History of COPD.  EXAM: PORTABLE CHEST - 1 VIEW  COMPARISON:  PA and lateral chest 08/22/2014.  CT chest 08/07/2009.  FINDINGS: Heart size is upper normal. Lungs are clear. No pneumothorax or pleural effusion. No focal bony abnormality.  IMPRESSION: No acute disease.   Electronically Signed   By: Drusilla Kanner M.D.   On: 01/27/2015 19:33    Medications:  I have reviewed the patient's current medications. Scheduled: . aspirin EC  81 mg Oral QHS  . atorvastatin  80 mg Oral Daily  . cefTRIAXone (ROCEPHIN)  IV  1 g Intravenous Q24H  . DULoxetine  60 mg Oral Daily  . ezetimibe  10 mg Oral Daily  . gabapentin  300 mg Oral TID  . heparin  5,000 Units Subcutaneous 3 times per day  . insulin aspart  0-20 Units Subcutaneous TID WC  . insulin aspart  0-5 Units Subcutaneous QHS  . insulin detemir  20 Units Subcutaneous QHS  .  isosorbide dinitrate  30 mg Oral Daily  . multivitamin-lutein  1 capsule Oral BID  . pantoprazole  40 mg Oral Daily  . potassium chloride  40 mEq Oral Once  . sodium chloride  3 mL Intravenous Q12H   Continuous: . sodium chloride     JYN:[id number]:acetaminophen **OR** acetaminophen, ondansetron **OR** ondansetron (ZOFRAN) IV, traMADol  Assessment/Plan: UTI with presumed Urosepsis.  Blood cultures were drawn in the ER, empiric Rocephin.  White count remains normal. DM2 with poor control.  Will try to refine her regimen while hospitalized, but unfortunately, she requires a basal-bolus type regimen and if she is unwilling  to follow directions/sliding scale, as has been the issue, will not be able to get this under control at home.  [facility], but she ultimately needs to take her meds as prescribed to be successful. Dehydration- mild.  Due to high glucose readings and general illness, will increase IV fluid rate as well. Hypokalemia K+ added to IV fluids and will replace PO as well. CAD- Troponins negative in ER HTN: Controlled Hyperlipidemia- Dual therapy with Lipitor 80 and Zetia Depression:  Continue Cymbalta- also for her pain issues.  She has not done very well since the loss of their daughter, which may relate to her general motivation, although she denies this. Thrombocytopenia:  Mild, will observe. COPD  Stable, no breathing issues, CXR ok in ER  Plan:  IV abx, hydration, mobilize, arrange some home health, likely home over weekend if blood cultures negative.   LOS: 1 day   Raelynn Corron W 01/28/2015, 7:57 AM

## 2015-01-28 NOTE — Progress Notes (Signed)
Critical Lab value showed that all blood cultures were positive for Gram negative rods.

## 2015-01-28 NOTE — Progress Notes (Signed)
Inpatient Diabetes Program Recommendations  AACE/ADA: New Consensus Statement on Inpatient Glycemic Control (2013)  Target Ranges:  Prepandial:   less than 140 mg/dL      Peak postprandial:   less than 180 mg/dL (1-2 hours)      Critically ill patients:  140 - 180 mg/dL   Reason for Visit: Referral received.  Spoke to patient regarding home diabetes management.  She states that CBG's have been out of control for some time now.  She see's Dr. Wylene Simmerisovec and Olegario MessierKathy, CDE and states she is working on it.  She admits that there is a lot of stress at home as she cares for her daughter and husband.  She admits that she does not always take insulin appropriately.  Patient is on a basal/bolus regimen at home which includes Levemir 25 units daily and Novolog 18 units with breakfast and supper and 14 units with lunch. She seems to understand the importance of caring for her diabetes, however currently she is struggling with caring for herself with the demands of caring for her family.  Needs follow-up with CDE at Dr. Deneen Hartsisovec's office.  I asked if she has any questions and she states no.  States she still does not feels well.  Will follow.  Thanks, Beryl MeagerJenny Nicolae Vasek, RN, BC-ADM Inpatient Diabetes Coordinator Pager [phone]

## 2015-01-28 NOTE — Care Management Note (Signed)
    Page 1 of 1   02/01/2015     11:50:48 AM CARE MANAGEMENT NOTE 02/01/2015  Patient:  Lisa Gardner,Lisa Gardner   Account Number:  [id number]  Date Initiated:  01/28/2015  Documentation initiated by:  Dayshia Ballinas  Subjective/Objective Assessment:   Pt adm on 01/27/15 with sepsis, UTI.  PTA, pt resides at home with spouse.     Action/Plan:   Will follow for dc needs as pt progresses.  Would benefit from PT consult when able to tolerate therapy.   Anticipated DC Date:  02/01/2015   Anticipated DC Plan:  SKILLED NURSING FACILITY  In-house referral  Clinical Social Worker      DC Planning Services  CM consult      Choice offered to / List presented to:             Status of service:  Completed, signed off Medicare Important Message given?  YES (If response is "NO", the following Medicare IM given date fields will be blank) Date Medicare IM given:  01/31/2015 Medicare IM given by:  Tracen Mahler Date Additional Medicare IM given:   Additional Medicare IM given by:    Discharge Disposition:  SKILLED NURSING FACILITY  Per UR Regulation:  Reviewed for med. necessity/level of care/duration of stay  If discussed at Long Length of Stay Meetings, dates discussed:   02/01/2015    Comments:  02/01/15 [name], RN, BSN [phone] Pt discharging to SNF today, per CSW arrangements.

## 2015-01-28 NOTE — Clinical Documentation Improvement (Signed)
MD's, NP's, PA's   When using the term "urosepsis" what do you mean?  Documentation of "Sepsis due to UTI"  and "UTI due to Urosepsis".   "Urosepsis" codes to "UTI" please make sure the documentation reflects the accurate diagnosis.  Thank you     Clarify if Urosepsis can be further specified as one of the diagnoses listed below and document in pn or d/c summary.  Possible Conditions? . Sepsis due to UTI . UTI . Other (please specify)  . Unable to determine . Unknown . Other Condition . Cannot Clinically Determine   Thank You, Lavonda JumboLawanda J Loyal Holzheimer ,RN Clinical Documentation Specialist:  [phone]  [facility]- Health Information Management

## 2015-01-28 NOTE — Progress Notes (Signed)
CRITICAL VALUE ALERT  Critical value received:  Gram Negative Rods  Date of notification:  01-28-15  Time of notification:  11:55AM  Critical value read back:yes  Nurse who received alert:  Junie PanningKeAnna RN  MD notified (1st page):  Tisovec  Time of first page:  12:28pm  MD notified (2nd page):  Time of second page:  Responding MD:  Tisovec  Time MD responded: 12:28pm  No new orders given at this time. Orders already in place. Will continue to monitor patient for further changes in condition.

## 2015-01-29 LAB — BASIC METABOLIC PANEL
Anion gap: 9 (ref 5–15)
BUN: 14 mg/dL (ref 6–23)
CALCIUM: 7 mg/dL — AB (ref 8.4–10.5)
CHLORIDE: 103 mmol/L (ref 96–112)
CO2: 19 mmol/L (ref 19–32)
Creatinine, Ser: 1.12 mg/dL — ABNORMAL HIGH (ref 0.50–1.10)
GFR calc Af Amer: 54 mL/min — ABNORMAL LOW (ref >90)
GFR calc non Af Amer: 46 mL/min — ABNORMAL LOW (ref >90)
Glucose, Bld: 145 mg/dL — ABNORMAL HIGH (ref 70–99)
POTASSIUM: 3.8 mmol/L (ref 3.5–5.1)
Sodium: 131 mmol/L — ABNORMAL LOW (ref 135–145)

## 2015-01-29 LAB — GLUCOSE, CAPILLARY
GLUCOSE-CAPILLARY: 150 mg/dL — AB (ref 70–99)
Glucose-Capillary: 140 mg/dL — ABNORMAL HIGH (ref 70–99)
Glucose-Capillary: 133 mg/dL — ABNORMAL HIGH (ref 70–99)
Glucose-Capillary: 102 mg/dL — ABNORMAL HIGH (ref 70–99)

## 2015-01-29 LAB — HEMOGLOBIN A1C
Hgb A1c MFr Bld: 11.7 % — ABNORMAL HIGH (ref 4.8–5.6)
MEAN PLASMA GLUCOSE: 289 mg/dL

## 2015-01-29 MED ORDER — CEFTRIAXONE SODIUM IN DEXTROSE 40 MG/ML IV SOLN
2.0000 g | INTRAVENOUS | Status: DC
  Administered 2015-01-29 – 2015-01-31 (×3): 2 g via INTRAVENOUS
  Filled 2015-01-29 (×4): qty 50

## 2015-01-29 NOTE — Progress Notes (Signed)
Subjective: Feels a bit better this AM.  We discussed how well her sugars are controlled currently with very little effort and no change from home regimen to her basal insulin.  She does not have an explanation for that.  We discussed that she is not able to take care of Jake SharkHarold if she cannot take care of herself and she is clearly failing in both accounts.  Social work did Scientific laboratory techniciancheck on [place] and APS has been notified as well.  Objective: Vital signs in last 24 hours: Temp:  [98.6 F (37 C)-98.7 F (37.1 C)] 98.6 F (37 C) (02/20 0606) Pulse Rate:  [76-79] 79 (02/20 0606) Resp:  [18-20] 20 (02/20 0606) BP: (90-117)/(38-60) 117/38 mmHg (02/20 0606) SpO2:  [95 %-100 %] 100 % (02/20 0606) Weight:  [82.509 kg (181 lb 14.4 oz)] 82.509 kg (181 lb 14.4 oz) (02/20 0606) Weight change: 5.398 kg (11 lb 14.4 oz) Last BM Date: 01/27/15  Intake/Output from previous day: 02/19 0701 - 02/20 0700 In: 4258.3 [P.O.:1900; I.V.:2188.3; IV Piggyback:50] Out: 875 [Urine:875] Intake/Output this shift:   General appearance: alert, cooperative and appears stated age Nose: Nares normal. Septum midline. Mucosa normal. No drainage or sinus tenderness. Throat: lips, mucosa, and tongue normal; teeth and gums normal Neck: no adenopathy, no carotid bruit, no JVD, supple, symmetrical, trachea midline and thyroid not enlarged, symmetric, no tenderness/mass/nodules Resp: clear to auscultation bilaterally Cardio: regular rate and rhythm, S1, S2 normal, no murmur, click, rub or gallop GI: soft, non-tender; bowel sounds normal; no masses, no organomegaly Extremities: extremities normal, atraumatic, no cyanosis or edema Skin: Skin color, texture, turgor normal. No rashes or lesions Neurologic: Grossly normal   Lab Results:  Recent Labs  01/27/15 1930 01/28/15 0454  WBC 8.8 7.7  HGB 13.0 11.8*  HCT 36.1 34.4*  PLT 111* 102*   BMET  Recent Labs  01/27/15 1930 01/28/15 0454  NA 130* 134*  K 3.9 3.4*  CL 98  104  CO2 22 24  GLUCOSE 241* 172*  BUN 20 17  CREATININE 1.53* 1.46*  CALCIUM 8.0* 7.3*    Studies/Results: Dg Chest Port 1 View  01/27/2015   CLINICAL DATA:  Altered mental status today.  History of COPD.  EXAM: PORTABLE CHEST - 1 VIEW  COMPARISON:  PA and lateral chest 08/22/2014.  CT chest 08/07/2009.  FINDINGS: Heart size is upper normal. Lungs are clear. No pneumothorax or pleural effusion. No focal bony abnormality.  IMPRESSION: No acute disease.   Electronically Signed   By: Drusilla Kannerhomas  Dalessio M.D.   On: 01/27/2015 19:33    Medications:  I have reviewed the patient's current medications. Scheduled: . aspirin EC  81 mg Oral QHS  . atorvastatin  80 mg Oral Daily  . cefTRIAXone (ROCEPHIN)  IV  1 g Intravenous Q24H  . DULoxetine  60 mg Oral Daily  . ezetimibe  10 mg Oral Daily  . gabapentin  300 mg Oral TID  . heparin  5,000 Units Subcutaneous 3 times per day  . insulin aspart  0-20 Units Subcutaneous TID WC  . insulin aspart  0-5 Units Subcutaneous QHS  . insulin detemir  20 Units Subcutaneous QHS  . isosorbide dinitrate  30 mg Oral Daily  . multivitamin-lutein  1 capsule Oral BID  . pantoprazole  40 mg Oral Daily  . sodium chloride  3 mL Intravenous Q12H   Continuous: . sodium chloride 100 mL/hr at 01/28/15 0800   AVW:[id number]:acetaminophen **OR** acetaminophen, ondansetron **OR** ondansetron (ZOFRAN) IV, traMADol  Assessment/Plan:  UTI with sepsis from urinary source. Blood cultures were drawn in the ER, empiric Rocephin as had grown gram negative rods (likely E. Coli). White count remains normal. DM2 with poor control.With no effort or changes to her home regimen, her blood sugars have been incredibly good.  This again shows that she clearly is not taking her insulin at home, and coupled with an A1C now of 11 shows that she simply is not taking her insulin. Dehydration- mild. Due to high glucose readings and general illness, IV fluids, with potassium Hypokalemia K+ added to IV  fluids and will replace PO as well.  Labs not drawn this AM so will order. CAD- Troponins negative in ER HTN: Controlled Hyperlipidemia- Dual therapy with Lipitor 80 and Zetia Depression: Continue Cymbalta- also for her pain issues. She has not done very well since the loss of their daughter, which may relate to her general motivation, although she denies this.  We may need to consider psych consult as well if there is resistance to placement. Thrombocytopenia: Mild, will observe. COPD Stable, no breathing issues, CXR ok in ER  Plan:Will draw another set of cultures and plan on PICC for Monday.  Will need 2 weeks IV antibiotics, sensitivities pending, but if sensitive to Rocephin, will be a good option.   I do not believe that she can return home as she shows self neglect with her insulin and cannot care for Jake Shark if she can't care for herself.  APS may need to work with both of them, but I would strongly recommend SNF at least for the duration of her IV abx needs as I don't believe that this is going to be a home option that would be successful for the above reasons.   LOS: 2 days   Dejion Grillo W 01/29/2015, 9:18 AM

## 2015-01-30 LAB — CULTURE, BLOOD (ROUTINE X 2)

## 2015-01-30 LAB — GLUCOSE, CAPILLARY
GLUCOSE-CAPILLARY: 84 mg/dL (ref 70–99)
GLUCOSE-CAPILLARY: 95 mg/dL (ref 70–99)
Glucose-Capillary: 163 mg/dL — ABNORMAL HIGH (ref 70–99)
Glucose-Capillary: 130 mg/dL — ABNORMAL HIGH (ref 70–99)

## 2015-01-30 LAB — BASIC METABOLIC PANEL
Anion gap: 1 — ABNORMAL LOW (ref 5–15)
BUN: 9 mg/dL (ref 6–23)
CO2: 28 mmol/L (ref 19–32)
CREATININE: 1.28 mg/dL — AB (ref 0.50–1.10)
Calcium: 7.2 mg/dL — ABNORMAL LOW (ref 8.4–10.5)
Chloride: 103 mmol/L (ref 96–112)
GFR calc Af Amer: 46 mL/min — ABNORMAL LOW (ref >90)
GFR, EST NON AFRICAN AMERICAN: 40 mL/min — AB (ref >90)
GLUCOSE: 102 mg/dL — AB (ref 70–99)
Potassium: 4 mmol/L (ref 3.5–5.1)
Sodium: 132 mmol/L — ABNORMAL LOW (ref 135–145)

## 2015-01-30 LAB — URINE CULTURE

## 2015-01-30 LAB — CBC
HCT: 31.2 % — ABNORMAL LOW (ref 36.0–46.0)
Hemoglobin: 10.6 g/dL — ABNORMAL LOW (ref 12.0–15.0)
MCH: 31.8 pg (ref 26.0–34.0)
MCHC: 34 g/dL (ref 30.0–36.0)
MCV: 93.7 fL (ref 78.0–100.0)
Platelets: 106 10*3/uL — ABNORMAL LOW (ref 150–400)
RBC: 3.33 MIL/uL — ABNORMAL LOW (ref 3.87–5.11)
RDW: 13.3 % (ref 11.5–15.5)
WBC: 5.2 10*3/uL (ref 4.0–10.5)

## 2015-01-30 MED ORDER — FUROSEMIDE 10 MG/ML IJ SOLN
40.0000 mg | Freq: Once | INTRAMUSCULAR | Status: AC
  Administered 2015-01-30: 40 mg via INTRAVENOUS

## 2015-01-30 NOTE — Clinical Social Work Note (Signed)
Bed offers given to patient. Patient has preference for [facility].  Roddie McBryant Antonyo Hinderer MSW, [place]CSWA, [place], [phone]

## 2015-01-30 NOTE — Progress Notes (Signed)
Subjective: More alert and awake this AM, talkative.  Feels a bit better, but still complains of general weakness.  Objective: Vital signs in last 24 hours: Temp:  [98 F (36.7 C)-101 F (38.3 C)] 98.4 F (36.9 C) (02/21 0741) Pulse Rate:  [76-83] 82 (02/21 0533) Resp:  [18] 18 (02/21 0533) BP: (102-116)/(45-49) 116/49 mmHg (02/21 0533) SpO2:  [97 %-100 %] 100 % (02/21 0533) Weight:  [84.913 kg (187 lb 3.2 oz)] 84.913 kg (187 lb 3.2 oz) (02/21 0533) Weight change: 2.404 kg (5 lb 4.8 oz) Last BM Date: 01/27/15  Intake/Output from previous day: 02/20 0701 - 02/21 0700 In: 3831.7 [P.O.:1420; I.V.:2411.7] Out: 1850 [Urine:1850] Intake/Output this shift:    General appearance: alert, cooperative and appears stated age Nose: Nares normal. Septum midline. Mucosa normal. No drainage or sinus tenderness. Throat: lips, mucosa, and tongue normal; teeth and gums normal Neck: no adenopathy, no carotid bruit, no JVD, supple, symmetrical, trachea midline and thyroid not enlarged, symmetric, no tenderness/mass/nodules Resp: clear to auscultation bilaterally Cardio: regular rate and rhythm, S1, S2 normal, no murmur, click, rub or gallop GI: soft, non-tender; bowel sounds normal; no masses, no organomegaly Extremities: extremities normal, atraumatic, no cyanosis or edema Skin: Skin color, texture, turgor normal. No rashes or lesions Neurologic: Grossly normal, better mood today  Lab Results:  Recent Labs  01/28/15 0454 01/30/15 0350  WBC 7.7 5.2  HGB 11.8* 10.6*  HCT 34.4* 31.2*  PLT 102* 106*   BMET  Recent Labs  01/29/15 1215 01/30/15 0350  NA 131* 132*  K 3.8 4.0  CL 103 103  CO2 19 28  GLUCOSE 145* 102*  BUN 14 9  CREATININE 1.12* 1.28*  CALCIUM 7.0* 7.2*    Studies/Results: No results found.  Medications:  I have reviewed the patient's current medications. Scheduled: . aspirin EC  81 mg Oral QHS  . atorvastatin  80 mg Oral Daily  . cefTRIAXone (ROCEPHIN)  IV   2 g Intravenous Q24H  . DULoxetine  60 mg Oral Daily  . ezetimibe  10 mg Oral Daily  . gabapentin  300 mg Oral TID  . heparin  5,000 Units Subcutaneous 3 times per day  . insulin aspart  0-20 Units Subcutaneous TID WC  . insulin aspart  0-5 Units Subcutaneous QHS  . insulin detemir  20 Units Subcutaneous QHS  . isosorbide dinitrate  30 mg Oral Daily  . multivitamin-lutein  1 capsule Oral BID  . pantoprazole  40 mg Oral Daily  . sodium chloride  3 mL Intravenous Q12H   Continuous: . sodium chloride 100 mL/hr at 01/29/15 1321   ZOX:[id number] **OR** acetaminophen, ondansetron **OR** ondansetron (ZOFRAN) IV, traMADol  Assessment/Plan: UTI with sepsis from urinary source. Blood cultures were drawn in the ER, E. Coli sensitive to Rocephin  On 2g per day due to sepsis.  PICC tomorrow and will need 2 weeks of IV abx total. DM2 with poor control.With no effort or changes to her home regimen, her blood sugars have been incredibly good. This again shows that she clearly is not taking her insulin at home, and coupled with an A1C now of 11 shows that she simply is not taking her insulin.  CBG 84 this AM, appetite also better. Dehydration- mild. Due to high glucose readings and general illness, IV fluids, with potassium Hypokalemia:  Replaced and normalized CAD- Troponins negative in ER HTN: Controlled Hyperlipidemia- Dual therapy with Lipitor 80 and Zetia Depression: Continue Cymbalta- also for her pain issues. She has not  done very well since the loss of their daughter, which may relate to her general motivation, although she denies this. We may need to consider psych consult as well if there is resistance to placement. Thrombocytopenia: Mild, will observe. COPD Stable, no breathing issues, CXR ok in ER Hyponatremia- Mild  Plan:Pan on PICC for Monday and possible SNF placement once has IV access. Will need 2 weeks IV antibiotics, sensitivities pending, but if sensitive to Rocephin,  will be a good option. I do not believe that she can return home as she shows self neglect with her insulin and cannot care for Jake SharkHarold if she can't care for herself. APS may need to work with both of them, but I would strongly recommend SNF at least for the duration of her IV abx needs as I don't believe that this is going to be a home option that would be successful for the above reasons.   LOS: 3 days   Earlean Fidalgo W 01/30/2015, 9:31 AM

## 2015-01-31 LAB — GLUCOSE, CAPILLARY
GLUCOSE-CAPILLARY: 165 mg/dL — AB (ref 70–99)
Glucose-Capillary: 58 mg/dL — ABNORMAL LOW (ref 70–99)
Glucose-Capillary: 93 mg/dL (ref 70–99)
Glucose-Capillary: 114 mg/dL — ABNORMAL HIGH (ref 70–99)
Glucose-Capillary: 151 mg/dL — ABNORMAL HIGH (ref 70–99)
Glucose-Capillary: 150 mg/dL — ABNORMAL HIGH (ref 70–99)

## 2015-01-31 LAB — BASIC METABOLIC PANEL
Anion gap: 4 — ABNORMAL LOW (ref 5–15)
BUN: 7 mg/dL (ref 6–23)
CHLORIDE: 103 mmol/L (ref 96–112)
CO2: 26 mmol/L (ref 19–32)
Calcium: 7.1 mg/dL — ABNORMAL LOW (ref 8.4–10.5)
Creatinine, Ser: 1.02 mg/dL (ref 0.50–1.10)
GFR calc Af Amer: 60 mL/min — ABNORMAL LOW (ref >90)
GFR, EST NON AFRICAN AMERICAN: 52 mL/min — AB (ref >90)
GLUCOSE: 63 mg/dL — AB (ref 70–99)
POTASSIUM: 3.2 mmol/L — AB (ref 3.5–5.1)
Sodium: 133 mmol/L — ABNORMAL LOW (ref 135–145)

## 2015-01-31 LAB — CBC
HCT: 30.6 % — ABNORMAL LOW (ref 36.0–46.0)
Hemoglobin: 10.6 g/dL — ABNORMAL LOW (ref 12.0–15.0)
MCH: 31.7 pg (ref 26.0–34.0)
MCHC: 34.6 g/dL (ref 30.0–36.0)
MCV: 91.6 fL (ref 78.0–100.0)
PLATELETS: 115 10*3/uL — AB (ref 150–400)
RBC: 3.34 MIL/uL — ABNORMAL LOW (ref 3.87–5.11)
RDW: 13.2 % (ref 11.5–15.5)
WBC: 5.4 10*3/uL (ref 4.0–10.5)

## 2015-01-31 MED ORDER — PROSIGHT PO TABS
1.0000 | ORAL_TABLET | Freq: Two times a day (BID) | ORAL | Status: DC
  Administered 2015-01-31 – 2015-02-01 (×2): 1 via ORAL
  Filled 2015-01-31 (×3): qty 1

## 2015-01-31 MED ORDER — FUROSEMIDE 80 MG PO TABS
80.0000 mg | ORAL_TABLET | Freq: Every day | ORAL | Status: DC
  Administered 2015-01-31 – 2015-02-01 (×2): 80 mg via ORAL
  Filled 2015-01-31 (×3): qty 1

## 2015-01-31 MED ORDER — INSULIN ASPART 100 UNIT/ML ~~LOC~~ SOLN
0.0000 [IU] | Freq: Three times a day (TID) | SUBCUTANEOUS | Status: DC
Start: 2015-01-31 — End: 2015-02-01
  Administered 2015-01-31 – 2015-02-01 (×2): 3 [IU] via SUBCUTANEOUS

## 2015-01-31 MED ORDER — POTASSIUM CHLORIDE CRYS ER 20 MEQ PO TBCR
20.0000 meq | EXTENDED_RELEASE_TABLET | Freq: Every day | ORAL | Status: DC
  Administered 2015-01-31 – 2015-02-01 (×2): 20 meq via ORAL
  Filled 2015-01-31 (×2): qty 1

## 2015-01-31 MED ORDER — SODIUM CHLORIDE 0.9 % IJ SOLN
10.0000 mL | INTRAMUSCULAR | Status: DC | PRN
  Administered 2015-02-01 (×2): 10 mL
  Filled 2015-01-31 (×2): qty 40

## 2015-01-31 NOTE — Discharge Summary (Signed)
See other notation. 

## 2015-01-31 NOTE — Progress Notes (Signed)
Peripherally Inserted Central Catheter/Midline Placement  The IV Nurse has discussed with the patient and/or persons authorized to consent for the patient, the purpose of this procedure and the potential benefits and risks involved with this procedure.  The benefits include less needle sticks, lab draws from the catheter and patient may be discharged home with the catheter.  Risks include, but not limited to, infection, bleeding, blood clot (thrombus formation), and puncture of an artery; nerve damage and irregular heat beat.  Alternatives to this procedure were also discussed.  PICC/Midline Placement Documentation        Stacie GlazeJoyce, Nereida Schepp Horton 01/31/2015, 9:14 AM

## 2015-01-31 NOTE — Progress Notes (Signed)
Subjective: Doing ok this AM, a bit tired.  Really didn't get up much at all yesterday.  Objective: Vital signs in last 24 hours: Temp:  [98 F (36.7 C)-98.2 F (36.8 C)] 98.2 F (36.8 C) (02/22 0537) Pulse Rate:  [74-77] 76 (02/22 0537) Resp:  [18-20] 18 (02/22 0537) BP: (99-113)/(48-73) 108/73 mmHg (02/22 0537) SpO2:  [100 %] 100 % (02/22 0537) Weight:  [84.62 kg (186 lb 8.9 oz)] 84.62 kg (186 lb 8.9 oz) (02/22 0537) Weight change: -0.293 kg (-10.4 oz) Last BM Date: 01/27/15  Intake/Output from previous day: 02/21 0701 - 02/22 0700 In: 1820 [P.O.:1320; I.V.:400; IV Piggyback:100] Out: 1800 [Urine:1800] Intake/Output this shift:  General appearance: alert, cooperative and appears stated age Nose: Nares normal. Septum midline. Mucosa normal. No drainage or sinus tenderness. Throat: lips, mucosa, and tongue normal; teeth and gums normal Neck: no adenopathy, no carotid bruit, no JVD, supple, symmetrical, trachea midline and thyroid not enlarged, symmetric, no tenderness/mass/nodules Resp: clear to auscultation bilaterally Cardio: regular rate and rhythm, S1, S2 normal, no murmur, click, rub or gallop GI: soft, non-tender; bowel sounds normal; no masses, no organomegaly Extremities: extremities normal, atraumatic, no cyanosis or edema Skin: Skin color, texture, turgor normal. No rashes or lesions Neurologic: Grossly normal,a bit tired, but nonfocal. Lab Results:  Recent Labs  01/30/15 0350 01/31/15 0500  WBC 5.2 5.4  HGB 10.6* 10.6*  HCT 31.2* 30.6*  PLT 106* 115*   BMET  Recent Labs  01/30/15 0350 01/31/15 0500  NA 132* 133*  K 4.0 3.2*  CL 103 103  CO2 28 26  GLUCOSE 102* 63*  BUN 9 7  CREATININE 1.28* 1.02  CALCIUM 7.2* 7.1*    Studies/Results: No results found.  Medications:  I have reviewed the patient's current medications. Scheduled: . aspirin EC  81 mg Oral QHS  . atorvastatin  80 mg Oral Daily  . cefTRIAXone (ROCEPHIN)  IV  2 g Intravenous  Q24H  . DULoxetine  60 mg Oral Daily  . ezetimibe  10 mg Oral Daily  . furosemide  80 mg Oral Daily  . gabapentin  300 mg Oral TID  . heparin  5,000 Units Subcutaneous 3 times per day  . insulin aspart  0-15 Units Subcutaneous TID WC  . insulin detemir  20 Units Subcutaneous QHS  . isosorbide dinitrate  30 mg Oral Daily  . multivitamin-lutein  1 capsule Oral BID  . pantoprazole  40 mg Oral Daily  . potassium chloride  20 mEq Oral Daily  . sodium chloride  3 mL Intravenous Q12H   Continuous:  ZOX:[id number]:acetaminophen **OR** acetaminophen, ondansetron **OR** ondansetron (ZOFRAN) IV, traMADol  Assessment/Plan: UTI with sepsis from urinary source. Blood cultures were drawn in the ER, E. Coli sensitive to Rocephin On 2g per day due to sepsis. PICC today and will need 2 weeks of IV abx total to complete on 02/11/15 DM2 with poor control.Will reduce SSI due to lower CBG's and poor PO intake, still well controlle on her current basal. Dehydration:  Resolved, actually needed some Lasix yesterday, will resume home Lasix dose to avoid further overload.  Sepsis resolving and better PO has helped this. Hypokalemia: Replaced this AM, will add 20 meq daily as resuming Lasix. CAD- Troponins negative in ER HTN: Controlled Hyperlipidemia- Dual therapy with Lipitor 80 and Zetia Depression: Continue Cymbalta- also for her pain issues. She has not done very well since the loss of their daughter, which may relate to her general motivation, although she denies this.  Thrombocytopenia: Mild, will observe. COPD Stable, no breathing issues, CXR ok in ER Hyponatremia- Mild  Plan:  PICC line today.  Per Social Work, shows preference for [facility].  There is no notation of bed availability and a printed FL-2 could not be located, will need to be sent to my office for signature [phone] Fax).  If she is able to get her PICC line and a suitable bed offer, will be ready for discharge, otherwise plan on discharge  tomorrow AM.  LOS: 4 days   TISOVEC,RICHARD W 01/31/2015, 7:50 AM

## 2015-01-31 NOTE — Progress Notes (Signed)
Physical Therapy Treatment Patient Details Name: Lisa Gardner MRN: [id number] DOB: 12/15/1935 Today's Date: 01/31/2015    History of Present Illness Patient is a 79 y.o. female admitted with CBG 300 and urosepsis.  PMH positive for TIA, COPD, DM, GERD, HLD and back surgery x 3.    PT Comments    Pt progressing with mobility but continues to be limited by dizziness. Requires min A for ambulation with and without AD. Complains of persistent HA, frontal area. PT will continue to follow.   Follow Up Recommendations  SNF;Supervision/Assistance - 24 hour     Equipment Recommendations  None recommended by PT    Recommendations for Other Services       Precautions / Restrictions Precautions Precautions: Fall Precaution Comments: previous fall in Sept, had several LOB during ambualtion with walker Restrictions Weight Bearing Restrictions: No    Mobility  Bed Mobility Overal bed mobility: Needs Assistance Bed Mobility: Supine to Sit;Sit to Supine     Supine to sit: Supervision Sit to supine: Min assist   General bed mobility comments: pt tried to sit straight up in bed and could not, vc's for rolling first, pt able to use rail with bed flat and roll over and sit up without physical assist. With return to bed, pt able to get legs into bed but not scoot them over for positioning, min A needed. Pt's legs very sensitive due to neuropathy  Transfers Overall transfer level: Needs assistance Equipment used: Rolling walker (2 wheeled);None Transfers: Sit to/from Stand Sit to Stand: Min assist         General transfer comment: practiced sit to stand multiple times with and without RW, min A needed to steady, even with RW, pt reports dizziness  Ambulation/Gait Ambulation/Gait assistance: Min assist;Mod assist Ambulation Distance (Feet): 115 Feet (25', 25', 75') Assistive device: 1 person hand held assist;Rolling walker (2 wheeled) Gait Pattern/deviations: Step-through  pattern;Trunk flexed;Shuffle Gait velocity: decreased Gait velocity interpretation: Below normal speed for age/gender General Gait Details: pt reports that she does not ambulate better with RW so first 2 ambulation without AD, one with HHA, one with min A from behind. Worked on gaze stabilization, pt without LOB with straight walking on level surface but loses balance with turning. Last ambulation with RW, pt was more steady but still had difficulty with LOB with turning and fatigued at end of walk and was not following commands as well. O2 sats remained 94% on RA.    Stairs            Wheelchair Mobility    Modified Rankin (Stroke Patients Only)       Balance Overall balance assessment: Needs assistance Sitting-balance support: Feet supported;No upper extremity supported Sitting balance-Leahy Scale: Fair Sitting balance - Comments: dizziness compromises balance in sitting and standing   Standing balance support: Single extremity supported;During functional activity Standing balance-Leahy Scale: Fair Standing balance comment: pt can stand for short period without support but cannot accept challenges or perform dynamic activity and loses balance with fatigue. BP 102/44 (which is where she has been running)                    Cognition Arousal/Alertness: Awake/alert Behavior During Therapy: WFL for tasks assessed/performed Overall Cognitive Status: Impaired/Different from baseline Area of Impairment: Orientation;Safety/judgement;Memory Orientation Level: Time;Place;Situation   Memory: Decreased short-term memory   Safety/Judgement: Decreased awareness of safety;Decreased awareness of deficits     General Comments: pt stressed during session because husband arrived and he  reports that he thinks he drove himself (he is not supposed to drive)    Exercises      General Comments        Pertinent Vitals/Pain Pain Assessment: 0-10 Pain Score: 7  Pain Location:  head Pain Descriptors / Indicators: Aching Pain Intervention(s): Monitored during session  O2 sats 95% on RA, HR 77 bpm, BP 102/44    Home Living                      Prior Function            PT Goals (current goals can now be found in the care plan section) Acute Rehab PT Goals Patient Stated Goal: To return to independent PT Goal Formulation: With patient Time For Goal Achievement: 02/11/15 Potential to Achieve Goals: Good Progress towards PT goals: Progressing toward goals    Frequency  Min 3X/week    PT Plan Current plan remains appropriate    Co-evaluation             End of Session Equipment Utilized During Treatment: Gait belt Activity Tolerance: Patient limited by fatigue Patient left: in bed;with call bell/phone within reach;with family/visitor present     Time: [id number] PT Time Calculation (min) (ACUTE ONLY): 33 min  Charges:  $Gait Training: 23-37 mins                    G Codes:     Lyanne CoVictoria Madason Rauls, PT  Acute Rehab Services  [phone]  StoyManess, [place] 01/31/2015, 11:56 AM

## 2015-02-01 LAB — BASIC METABOLIC PANEL
Anion gap: 6 (ref 5–15)
BUN: 8 mg/dL (ref 6–23)
CALCIUM: 7.5 mg/dL — AB (ref 8.4–10.5)
CHLORIDE: 103 mmol/L (ref 96–112)
CO2: 30 mmol/L (ref 19–32)
CREATININE: 0.95 mg/dL (ref 0.50–1.10)
GFR calc Af Amer: 66 mL/min — ABNORMAL LOW (ref >90)
GFR calc non Af Amer: 57 mL/min — ABNORMAL LOW (ref >90)
Glucose, Bld: 79 mg/dL (ref 70–99)
Potassium: 3.3 mmol/L — ABNORMAL LOW (ref 3.5–5.1)
Sodium: 139 mmol/L (ref 135–145)

## 2015-02-01 LAB — CBC
HCT: 30.8 % — ABNORMAL LOW (ref 36.0–46.0)
Hemoglobin: 10.7 g/dL — ABNORMAL LOW (ref 12.0–15.0)
MCH: 31.7 pg (ref 26.0–34.0)
MCHC: 34.7 g/dL (ref 30.0–36.0)
MCV: 91.1 fL (ref 78.0–100.0)
PLATELETS: 145 10*3/uL — AB (ref 150–400)
RBC: 3.38 MIL/uL — ABNORMAL LOW (ref 3.87–5.11)
RDW: 13.2 % (ref 11.5–15.5)
WBC: 7.1 10*3/uL (ref 4.0–10.5)

## 2015-02-01 LAB — GLUCOSE, CAPILLARY
GLUCOSE-CAPILLARY: 79 mg/dL (ref 70–99)
Glucose-Capillary: 63 mg/dL — ABNORMAL LOW (ref 70–99)
Glucose-Capillary: 159 mg/dL — ABNORMAL HIGH (ref 70–99)

## 2015-02-01 MED ORDER — POTASSIUM CHLORIDE CRYS ER 20 MEQ PO TBCR
40.0000 meq | EXTENDED_RELEASE_TABLET | Freq: Once | ORAL | Status: AC
  Administered 2015-02-01: 40 meq via ORAL
  Filled 2015-02-01: qty 2

## 2015-02-01 MED ORDER — BENZONATATE 200 MG PO CAPS: 200.0000 mg | ORAL_CAPSULE | Freq: Three times a day (TID) | ORAL | Status: AC | PRN

## 2015-02-01 MED ORDER — BENZONATATE 100 MG PO CAPS
200.0000 mg | ORAL_CAPSULE | Freq: Three times a day (TID) | ORAL | Status: DC | PRN
  Administered 2015-02-01: 200 mg via ORAL
  Filled 2015-02-01 (×2): qty 2

## 2015-02-01 MED ORDER — CEFTRIAXONE SODIUM IN DEXTROSE 40 MG/ML IV SOLN: 2.0000 g | INTRAVENOUS | Status: AC

## 2015-02-01 MED ORDER — INSULIN DETEMIR 100 UNIT/ML ~~LOC~~ SOLN: 20.0000 [IU] | Freq: Every day | SUBCUTANEOUS | Status: AC

## 2015-02-01 MED ORDER — INSULIN ASPART 100 UNIT/ML ~~LOC~~ SOLN: 0.0000 [IU] | Freq: Three times a day (TID) | SUBCUTANEOUS | Status: AC

## 2015-02-01 MED ORDER — SODIUM CHLORIDE 0.9 % IV SOLN: Freq: Once | INTRAVENOUS | Status: DC

## 2015-02-01 MED ORDER — HEPARIN SOD (PORK) LOCK FLUSH 100 UNIT/ML IV SOLN
250.0000 [IU] | INTRAVENOUS | Status: AC | PRN
Start: 2015-02-01 — End: 2015-02-01
  Administered 2015-02-01: 250 [IU]

## 2015-02-01 MED ORDER — ZOLEDRONIC ACID 5 MG/100ML IV SOLN: 5.0000 mg | Freq: Once | INTRAVENOUS | Status: DC

## 2015-02-01 NOTE — Progress Notes (Signed)
Hypoglycemic Event  CBG:63  Treatment: 1 cup orange juice  Symptoms: none  Follow-up CBG: Time:0631 CBG Result:79  Possible Reasons for Event: Pt received 20units of levemir last night  Comments/MD notified:hypoglycemia protocol    Lisa Gardner, Lisa Gardner  Remember to initiate Hypoglycemia Order Set & complete

## 2015-02-01 NOTE — Progress Notes (Signed)
Inpatient Diabetes Program Recommendations  AACE/ADA: New Consensus Statement on Inpatient Glycemic Control (2013)  Target Ranges:  Prepandial:   less than 140 mg/dL      Peak postprandial:   less than 180 mg/dL (1-2 hours)      Critically ill patients:  140 - 180 mg/dL  Results for Lisa Gardner, Lisa Gardner (MRN [id number]) as of 02/01/2015 10:16  Ref. Range 01/31/2015 05:41 01/31/2015 06:08 01/31/2015 11:53 01/31/2015 15:42 01/31/2015 16:47 01/31/2015 21:01 02/01/2015 06:05 02/01/2015 06:31  Glucose-Capillary Latest Range: 70-99 mg/dL 58 (L) 93 409114 (H) 811165 (H) 151 (H) 150 (H) 63 (L) 79   Inpatient Diabetes Program Recommendations Insulin - Basal: consider reducing Levemir to 18 units  Thank you  Lisa Gardner BSN, RN,CDE Inpatient Diabetes Coordinator [phone] (team pager)

## 2015-02-01 NOTE — Discharge Summary (Signed)
DISCHARGE SUMMARY  Lisa Gardner  MR#: [id number]  DOB:09-27-1936  Date of Admission: 01/27/2015 Date of Discharge: 02/01/2015  Attending Physician:Jerome Viglione W  Patient's MWU:[id number]:Nikan Ellingson W, MD  Discharge Diagnoses:  Sepsis with E. Coli from urinary source  Active Problems:  CAD (coronary artery disease)  Diabetes Mellitus type 2 with renal, neuro, optical complications, poorly controlled  Medication noncompliance  Sepsis due to urinary tract infection  HTN  Hyperlipidemia  Major depression, moderate, recurrent  Protein Calorie Malnutrition  Hyponatremia, mild  Thrombocytopenia, mild    Discharge Medications:   Medication List    STOP taking these medications        glimepiride 4 MG tablet  Commonly known as:  AMARYL      TAKE these medications        aspirin EC 81 MG tablet  Take 81 mg by mouth at bedtime.     atorvastatin 80 MG tablet  Commonly known as:  LIPITOR  Take 80 mg by mouth daily.     benzonatate 200 MG capsule  Commonly known as:  TESSALON  Take 1 capsule (200 mg total) by mouth 3 (three) times daily as needed for cough.     calcium-vitamin D 250-125 MG-UNIT per tablet  Commonly known as:  OSCAL WITH D  Take 2 tablets by mouth daily.     cefTRIAXone 40 MG/ML IVPB  Commonly known as:  ROCEPHIN  Inject 50 mLs (2 g total) into the vein daily.     CINNAMON PO  Take 2 tablets by mouth daily.     DULoxetine 60 MG capsule  Commonly known as:  CYMBALTA  Take 60 mg by mouth daily.     ezetimibe 10 MG tablet  Commonly known as:  ZETIA  Take 10 mg by mouth daily.     ferrous sulfate 325 (65 FE) MG tablet  Take 325 mg by mouth daily with breakfast.     furosemide 80 MG tablet  Commonly known as:  LASIX  Take 80-160 mg by mouth daily. Take 1 tablet by mouth alternating with 2 tablets every other day     gabapentin 300 MG capsule  Commonly known as:  NEURONTIN  Take 300 mg by mouth 3 (three) times daily.     insulin aspart 100  UNIT/ML injection  Commonly known as:  novoLOG  - Inject 0-15 Units into the skin 3 (three) times daily with meals. CBG < 70: implement hypoglycemia protocol  - CBG 70 - 120: 0 units  - CBG 121 - 150: 2 units  - CBG 151 - 200: 3 units  - CBG 201 - 250: 5 units  - CBG 251 - 300: 8 units  - CBG 301 - 350: 11 units  - CBG 351 - 400: 15 units  - CBG > 400: call MD and obtain STAT lab verification     insulin detemir 100 UNIT/ML injection  Commonly known as:  LEVEMIR  Inject 0.2 mLs (20 Units total) into the skin at bedtime.     isosorbide dinitrate 30 MG tablet  Commonly known as:  ISORDIL  Take 30 mg by mouth daily.     losartan 50 MG tablet  Commonly known as:  COZAAR  Take 50 mg by mouth daily.     metFORMIN 1000 MG tablet  Commonly known as:  GLUCOPHAGE  Take 1,000 mg by mouth 2 (two) times daily with a meal.     multivitamin-lutein Caps capsule  Take 1 capsule by mouth 2 (two)  times daily.     multivitamin with minerals tablet  Take 1 tablet by mouth daily.     omeprazole 40 MG capsule  Commonly known as:  PRILOSEC  Take 40 mg by mouth daily.     potassium chloride SA 20 MEQ tablet  Commonly known as:  K-DUR,KLOR-CON  Take 20 mEq by mouth daily.     traMADol 50 MG tablet  Commonly known as:  ULTRAM  Take 1 tablet (50 mg total) by mouth every 6 (six) hours as needed.     vitamin C 1000 MG tablet  Take 1,000 mg by mouth daily.        Hospital Procedures: Dg Chest Port 1 View  01/27/2015   CLINICAL DATA:  Altered mental status today.  History of COPD.  EXAM: PORTABLE CHEST - 1 VIEW  COMPARISON:  PA and lateral chest 08/22/2014.  CT chest 08/07/2009.  FINDINGS: Heart size is upper normal. Lungs are clear. No pneumothorax or pleural effusion. No focal bony abnormality.  IMPRESSION: No acute disease.   Electronically Signed   By: Drusilla Kanner M.D.   On: 01/27/2015 19:33    History of Present Illness: Lisa Gardner was admitted through the ER on 2/18  after contacting our office on 2/17, being told to go to the ER, refusing, and then not accepting an OV 2/18 AM  Hospital Course: History of Present Illness: Lisa Gardner was admitted through the ER on 2/18 after contacting our office on 2/17, being told to go to the ER, refusing, and then not accepting an OV 2/18 AM. She indicates that she did not have a ride. Has no local family other than her husband Jake Shark, who has dementia and she cares for. She does this at her own level of neglect. APS has been contacted for his care and neighbors are "helping out" per report. She was started on IV Rocephin and her blood cultures were quickly positive and she had E. Coli which was sensitive to Rocephin as well. This matched her urine cultures, noting a urinary source of sepsis,which had been suspected. She was initially stabilized with IV hydration, but as her symptoms improved, was put back on her home Lasix due to mild fluid overload.  In addition, she has had poorly controlled DM2 per her office visits and has worked with our in office CDE, but this was thought to be due to medication noncompliance. She was put on her home dose of basal insulin and has had remarkably stable diabetic control, Her A1C was in the 11 range. We discussed her self neglect and she indicates that she takes care of her husband and not herself and that she has "rationed" her insulin due to costs. WE discussed why this is generally a bad idea, noting her glucose toxicity and likely worsening of her clinical situation due to this, as well has her triopathy.  Social work is working on arranging SNF as she has been poorly mobile since arrival and concerns over getting appropriate IV abx at home given limitations. She is to receive PT for strengthening and further disease education before returning home. Hopefully this will improve her outpatient compliance.   Day of Discharge Exam BP 115/40 mmHg  Pulse 74  Temp(Src) 99.9 F  (37.7 C) (Oral)  Resp 18  Ht  (1.651 m)  Wt 78.926 kg (174 lb)  BMI 28.96 kg/m2  SpO2 99%  Physical Exam: General appearance: alert, cooperative and appears stated age Nose: Nares normal. Septum midline. Mucosa  normal. No drainage or sinus tenderness. Throat: lips, mucosa, and tongue normal; teeth and gums normal Neck: no adenopathy, no carotid bruit, no JVD, supple, symmetrical, trachea midline and thyroid not enlarged, symmetric, no tenderness/mass/nodules Resp: clear to auscultation bilaterally Cardio: regular rate and rhythm, S1, S2 normal, no murmur, click, rub or gallop GI: soft, non-tender; bowel sounds normal; no masses, no organomegaly Extremities: extremities normal, atraumatic, no cyanosis or edema Skin: Skin color, texture, turgor normal. No rashes or lesions Neurologic: Grossly normal,a bit tired, but nonfocal. Discharge Labs:  Recent Labs  01/31/15 0500 02/01/15 0455  NA 133* 139  K 3.2* 3.3*  CL 103 103  CO2 26 30  GLUCOSE 63* 79  BUN 7 8  CREATININE 1.02 0.95  CALCIUM 7.1* 7.5*    Recent Labs  01/31/15 0500 02/01/15 0455  WBC 5.4 7.1  HGB 10.6* 10.7*  HCT 30.6* 30.8*  MCV 91.6 91.1  PLT 115* 145*   Lab Results  Component Value Date   INR 2.3* 08/10/2009   INR 2.2* 08/09/2009   INR 2.0* 08/08/2009   Discharge instructions: 2 weeks total IV antibiotics  (Rocephin 2g per PICC daily) to complete on 02/11/15 .  OK to remove PICC after last dose given.  Disposition: Nursing facility Follow-up Appts: Follow-up with Dr. Wylene Simmer at [facility] in 1 week following SNF discharge. Call for appointment.  Condition on Discharge: Stable, improved  Tests Needing Follow-up: [facility] in 1 week.  Signed: Gaspar Garbe 02/01/2015, 8:15 AM

## 2015-02-01 NOTE — Progress Notes (Signed)
Non-Tele, DC to SNF, Discharge with PICC. Discharge instructions and home medications discussed with patient. Patient denied any questions or concerns at this time. Patient leaving unit via EMS and appears in no acute distress.

## 2015-02-01 NOTE — Progress Notes (Signed)
PICC line capped, flushed with 10cc NS, GBR and capped with Heparin 250 units. Consuello Masseimmons, Velina Drollinger M

## 2015-02-01 NOTE — Progress Notes (Signed)
Report given to receiving RN at [facility]. 

## 2015-02-02 ENCOUNTER — Encounter: Payer: Self-pay | Admitting: Internal Medicine

## 2015-02-02 ENCOUNTER — Non-Acute Institutional Stay (SKILLED_NURSING_FACILITY): Payer: Medicare Other | Admitting: Internal Medicine

## 2015-02-02 DIAGNOSIS — A4151 Sepsis due to Escherichia coli [E. coli]: Secondary | ICD-10-CM

## 2015-02-02 DIAGNOSIS — E785 Hyperlipidemia, unspecified: Secondary | ICD-10-CM

## 2015-02-02 DIAGNOSIS — K219 Gastro-esophageal reflux disease without esophagitis: Secondary | ICD-10-CM

## 2015-02-02 DIAGNOSIS — B9620 Unspecified Escherichia coli [E. coli] as the cause of diseases classified elsewhere: Secondary | ICD-10-CM | POA: Insufficient documentation

## 2015-02-02 DIAGNOSIS — I2510 Atherosclerotic heart disease of native coronary artery without angina pectoris: Secondary | ICD-10-CM

## 2015-02-02 DIAGNOSIS — N390 Urinary tract infection, site not specified: Secondary | ICD-10-CM

## 2015-02-02 NOTE — Assessment & Plan Note (Signed)
Stated as uncontrolled 2/2 med non compliance -A1c was 11.7- but controlled well on basal insulin in hospital; pt was d/c on glucophage 1000 mg BID and levemir 20 u qHS and SSI; also ARB and statin

## 2015-02-02 NOTE — Progress Notes (Signed)
MRN: [id number] Name: Lisa Gardner  Sex: female Age: 79 y.o. DOB: [birth year]  [facility]: [facility] Facility/Room: Level Of Care: SNF Provider: Merrilee Seashore D Emergency Contacts: Extended Emergency Contact Information Primary Emergency Contact: Gu,Harold Address: [address]          [place], [place] [address] [place] Home Phone: [phone] Work Phone: [phone] Relation: Spouse Secondary Emergency Contact: Connor,John  [place] Mobile Phone: [phone] Relation: Brother  Code Status:   Allergies: Garlic; Kiwi extract; and Vitamin e  Chief Complaint  Patient presents with  . New Admit To SNF    HPI: Patient is 79 y.o. female who is admitted to SNF to continue IV abx for urosepsis until 3/4 and for generalized weaknes for OT/PT.  Past Medical History  Diagnosis Date  . TIA (transient ischemic attack) 2009 and 2011  . COPD (chronic obstructive pulmonary disease)   . Diabetes mellitus without complication     insulin dependant  . GERD (gastroesophageal reflux disease)   . Diabetic neuropathy   . Neuromuscular disorder     [place]  . Coronary artery disease 2008    BY CATH, NON OBSTRUCTIVE, last nuc 2013 no ischemia EF 57%  . Hyperlipidemia LDL goal < 70     Past Surgical History  Procedure Laterality Date  . Back surgery  1998, 1975    Lumbar  . Joint replacement  Feb. 2011/August 2011    Bilateral Knees   . Abdominal hysterectomy  1975    Partial, still has ovaries.  . Tonsillectomy  1949  . Cardiac catheterization  2008    30% LM, 40% diag, 50% RCA      Medication List       This list is accurate as of: 02/02/15 11:17 AM.  Always use your most recent med list.               aspirin EC 81 MG tablet  Take 81 mg by mouth at bedtime.     atorvastatin 80 MG tablet  Commonly known as:  LIPITOR  Take 80 mg by mouth daily.     benzonatate 200 MG capsule  Commonly known as:  TESSALON  Take 1  capsule (200 mg total) by mouth 3 (three) times daily as needed for cough.     calcium-vitamin D 250-125 MG-UNIT per tablet  Commonly known as:  OSCAL WITH D  Take 2 tablets by mouth daily.     cefTRIAXone 40 MG/ML IVPB  Commonly known as:  ROCEPHIN  Inject 50 mLs (2 g total) into the vein daily.     CINNAMON PO  Take 2 tablets by mouth daily.     DULoxetine 60 MG capsule  Commonly known as:  CYMBALTA  Take 60 mg by mouth daily.     ezetimibe 10 MG tablet  Commonly known as:  ZETIA  Take 10 mg by mouth daily.     ferrous sulfate 325 (65 FE) MG tablet  Take 325 mg by mouth daily with breakfast.     furosemide 80 MG tablet  Commonly known as:  LASIX  Take 80-160 mg by mouth daily. Take 1 tablet by mouth alternating with 2 tablets every other day     gabapentin 300 MG capsule  Commonly known as:  NEURONTIN  Take 300 mg by mouth 3 (three) times daily.     insulin aspart 100 UNIT/ML injection  Commonly known as:  novoLOG  - Inject 0-15 Units into  the skin 3 (three) times daily with meals. CBG < 70: implement hypoglycemia protocol  - CBG 70 - 120: 0 units  - CBG 121 - 150: 2 units  - CBG 151 - 200: 3 units  - CBG 201 - 250: 5 units  - CBG 251 - 300: 8 units  - CBG 301 - 350: 11 units  - CBG 351 - 400: 15 units  - CBG > 400: call MD and obtain STAT lab verification     insulin detemir 100 UNIT/ML injection  Commonly known as:  LEVEMIR  Inject 0.2 mLs (20 Units total) into the skin at bedtime.     isosorbide dinitrate 30 MG tablet  Commonly known as:  ISORDIL  Take 30 mg by mouth daily.     losartan 50 MG tablet  Commonly known as:  COZAAR  Take 50 mg by mouth daily.     metFORMIN 1000 MG tablet  Commonly known as:  GLUCOPHAGE  Take 1,000 mg by mouth 2 (two) times daily with a meal.     multivitamin-lutein Caps capsule  Take 1 capsule by mouth 2 (two) times daily.     multivitamin with minerals tablet  Take 1 tablet by mouth daily.     omeprazole 40  MG capsule  Commonly known as:  PRILOSEC  Take 40 mg by mouth daily.     potassium chloride SA 20 MEQ tablet  Commonly known as:  K-DUR,KLOR-CON  Take 20 mEq by mouth daily.     traMADol 50 MG tablet  Commonly known as:  ULTRAM  Take 1 tablet (50 mg total) by mouth every 6 (six) hours as needed.     vitamin C 1000 MG tablet  Take 1,000 mg by mouth daily.        No orders of the defined types were placed in this encounter.     There is no immunization history on file for this patient.  History  Substance Use Topics  . Smoking status: Never Smoker   . Smokeless tobacco: Never Used  . Alcohol Use: No    Family history is noncontributory    Review of Systems  DATA OBTAINED: from patient, nurse, medical record GENERAL:  no fevers, fatigue, appetite changes SKIN: No itching, rash or wounds EYES: No eye pain, redness, discharge EARS: No earache, tinnitus, change in hearing NOSE: No congestion, drainage or bleeding  MOUTH/THROAT: No mouth or tooth pain, No sore throat RESPIRATORY: No cough, wheezing, SOB CARDIAC: No chest pain, palpitations, lower extremity edema  GI: No abdominal pain, No N/V/D or constipation, No heartburn or reflux  GU: No dysuria, frequency or urgency, or incontinence  MUSCULOSKELETAL: No unrelieved bone/joint pain NEUROLOGIC: No headache, dizziness; LE neuropathy PSYCHIATRIC: No overt anxiety or sadness, No behavior issue.   Filed Vitals:   02/02/15 0957  BP: 98/54  Pulse: 78  Temp: 96.8 F (36 C)  Resp: 18    Physical Exam  GENERAL APPEARANCE: Alert, conversant,  No acute distress.  SKIN: No diaphoresis rash HEAD: Normocephalic, atraumatic  EYES: Conjunctiva/lids clear. Pupils round, reactive. EOMs intact.  EARS: External exam WNL, canals clear. Hearing grossly normal.  NOSE: No deformity or discharge.  MOUTH/THROAT: Lips w/o lesions  RESPIRATORY: Breathing is even, unlabored. Lung sounds are clear   CARDIOVASCULAR: Heart RRR no  murmurs, rubs or gallops. 1+ peripheral edema.   GASTROINTESTINAL: Abdomen is soft, non-tender, not distended w/ normal bowel sounds. GENITOURINARY: Bladder non tender, not distended  MUSCULOSKELETAL: No abnormal joints or  musculature NEUROLOGIC:  Cranial nerves 2-12 grossly intact. Moves all extremities  PSYCHIATRIC: Mood and affect appropriate to situation, no behavioral issues  Patient Active Problem List   Diagnosis Date Noted  . E-coli UTI 02/02/2015  . Sepsis due to Escherichia coli 01/27/2015  . Atypical chest pain 01/07/2014  . GERD (gastroesophageal reflux disease) 01/07/2014  . Chest pain 12/07/2013  . CAD (coronary artery disease) 12/07/2013  . Hyperlipidemia with target LDL less than 70 12/07/2013  . Diabetes type 2, uncontrolled 12/07/2013    CBC    Component Value Date/Time   WBC 7.1 02/01/2015 0455   RBC 3.38* 02/01/2015 0455   HGB 10.7* 02/01/2015 0455   HCT 30.8* 02/01/2015 0455   PLT 145* 02/01/2015 0455   MCV 91.1 02/01/2015 0455   LYMPHSABS 0.4* 01/27/2015 1930   MONOABS 0.9 01/27/2015 1930   EOSABS 0.0 01/27/2015 1930   BASOSABS 0.0 01/27/2015 1930    CMP     Component Value Date/Time   NA 139 02/01/2015 0455   K 3.3* 02/01/2015 0455   CL 103 02/01/2015 0455   CO2 30 02/01/2015 0455   GLUCOSE 79 02/01/2015 0455   BUN 8 02/01/2015 0455   CREATININE 0.95 02/01/2015 0455   CALCIUM 7.5* 02/01/2015 0455   PROT 5.4* 01/27/2015 1930   ALBUMIN 2.6* 01/27/2015 1930   AST 28 01/27/2015 1930   ALT 22 01/27/2015 1930   ALKPHOS 79 01/27/2015 1930   BILITOT 1.3* 01/27/2015 1930   GFRNONAA 57* 02/01/2015 0455   GFRAA 66* 02/01/2015 0455    Assessment and Plan  Sepsis due to Escherichia coli E. Coli;treated with IV rocephin and d/c on rocephin with PICC until 3/4   E-coli UTI As per sepsis, treated with IV rocephin until 3/4   Diabetes type 2, uncontrolled Stated as uncontrolled 2/2 med non compliance -A1c was 11.7- but controlled well on basal  insulin in hospital; pt was d/c on glucophage 1000 mg BID and levemir 20 u qHS and SSI; also ARB and statin   Hyperlipidemia with target LDL less than 70 Pt put on lipitor 80 and zetia 10 mg, no labs   GERD (gastroesophageal reflux disease) Continue omeprazole 40 mg daily   CAD (coronary artery disease) Pt in ASA, isordil, statin, ARB     Margit HanksALEXANDER, Quandarius Nill D, MD

## 2015-02-02 NOTE — Assessment & Plan Note (Signed)
E. Coli;treated with IV rocephin and d/c on rocephin with PICC until 3/4

## 2015-02-02 NOTE — Assessment & Plan Note (Signed)
As per sepsis, treated with IV rocephin until 3/4

## 2015-02-02 NOTE — Assessment & Plan Note (Signed)
Continue omeprazole 40 mg daily

## 2015-02-02 NOTE — Assessment & Plan Note (Signed)
Pt put on lipitor 80 and zetia 10 mg, no labs

## 2015-02-02 NOTE — Assessment & Plan Note (Signed)
Pt in ASA, isordil, statin, ARB

## 2015-02-03 ENCOUNTER — Ambulatory Visit: Payer: PRIVATE HEALTH INSURANCE

## 2015-02-03 LAB — CULTURE, BLOOD (SINGLE): CULTURE: NO GROWTH

## 2015-02-04 LAB — CULTURE, BLOOD (ROUTINE X 2)
CULTURE: NO GROWTH
Culture: NO GROWTH

## 2015-02-07 ENCOUNTER — Ambulatory Visit (HOSPITAL_COMMUNITY): Admission: RE | Admit: 2015-02-07 | Payer: Medicare Other | Source: Ambulatory Visit

## 2015-02-10 ENCOUNTER — Encounter: Payer: Self-pay | Admitting: Internal Medicine

## 2015-02-10 ENCOUNTER — Non-Acute Institutional Stay (SKILLED_NURSING_FACILITY): Payer: Medicare Other | Admitting: Internal Medicine

## 2015-02-10 DIAGNOSIS — B9620 Unspecified Escherichia coli [E. coli] as the cause of diseases classified elsewhere: Secondary | ICD-10-CM | POA: Diagnosis not present

## 2015-02-10 DIAGNOSIS — R05 Cough: Secondary | ICD-10-CM | POA: Diagnosis not present

## 2015-02-10 DIAGNOSIS — J069 Acute upper respiratory infection, unspecified: Secondary | ICD-10-CM | POA: Diagnosis not present

## 2015-02-10 DIAGNOSIS — R059 Cough, unspecified: Secondary | ICD-10-CM | POA: Insufficient documentation

## 2015-02-10 DIAGNOSIS — R5081 Fever presenting with conditions classified elsewhere: Secondary | ICD-10-CM

## 2015-02-10 DIAGNOSIS — N390 Urinary tract infection, site not specified: Secondary | ICD-10-CM | POA: Diagnosis not present

## 2015-02-10 DIAGNOSIS — A4151 Sepsis due to Escherichia coli [E. coli]: Secondary | ICD-10-CM

## 2015-02-10 NOTE — Progress Notes (Signed)
Patient ID: Lisa Gardner, female   DOB: Oct 04, 1936, 79 y.o.   MRN: [id number]   this is an acute visit.  Level care skilled.   Facility [facility]  Allergies: Garlic; Kiwi extract; and Vitamin e  Chief Complaint  Patient presents with  .  acute visit secondary to fever unknown origin-cough congestion     HPI: Patient is 79 y.o. female who is admitted to SNF to continue IV abx for urosepsis until 3/4 and for generalized weaknes for OT/PT.--She is completing a course of Rocephin.  However the last day or so she has developed some cough wheezing-also spiked a temperature of 101.9 yesterday this did come down fairly quickly with Tylenol.  She continues to have a somewhat nonproductive cough-she did previously receive a nebulizer treatment that did help-she also has received Robitussin apparently this is not helping much with her cough-she does have a when necessary Tessalon pearls as well.  Currently she is afebrile vital signs are stable.   she does not complain of any acute shortness of breath fever or chills currently  Past Medical History  Diagnosis Date  . TIA (transient ischemic attack) 2009 and 2011  . COPD (chronic obstructive pulmonary disease)   . Diabetes mellitus without complication     insulin dependant  . GERD (gastroesophageal reflux disease)   . Diabetic neuropathy   . Neuromuscular disorder     Lumber Four-Five  . Coronary artery disease 2008    BY CATH, NON OBSTRUCTIVE, last nuc 2013 no ischemia EF 57%  . Hyperlipidemia LDL goal < 70     Past Surgical History  Procedure Laterality Date  . Back surgery  1998, 1975    Lumbar  . Joint replacement  Feb. 2011/August 2011    Bilateral Knees   . Abdominal hysterectomy  1975    Partial, still has ovaries.  . Tonsillectomy  1949  . Cardiac catheterization  2008    30% LM, 40% diag, 50% RCA      Medication List                      aspirin EC 81 MG tablet  Take 81 mg by mouth at bedtime.     atorvastatin 80 MG tablet  Commonly known as:  LIPITOR  Take 80 mg by mouth daily.     benzonatate 200 MG capsule  Commonly known as:  TESSALON  Take 1 capsule (200 mg total) by mouth 3 (three) times daily as needed for cough.     calcium-vitamin D 250-125 MG-UNIT per tablet  Commonly known as:  OSCAL WITH D  Take 2 tablets by mouth daily.     cefTRIAXone 40 MG/ML IVPB  Commonly known as:  ROCEPHIN  Inject 50 mLs (2 g total) into the vein daily.     CINNAMON PO  Take 2 tablets by mouth daily.     DULoxetine 60 MG capsule  Commonly known as:  CYMBALTA  Take 60 mg by mouth daily.     ezetimibe 10 MG tablet  Commonly known as:  ZETIA  Take 10 mg by mouth daily.     ferrous sulfate 325 (65 FE) MG tablet  Take 325 mg by mouth daily with breakfast.     furosemide 80 MG tablet  Commonly known as:  LASIX  Take 80-160 mg by mouth daily. Take 1 tablet by mouth alternating with 2 tablets every other day     gabapentin 300 MG capsule  Commonly  known as:  NEURONTIN  Take 300 mg by mouth 3 (three) times daily.     insulin aspart 100 UNIT/ML injection  Commonly known as:  novoLOG  - Inject 0-15 Units into the skin 3 (three) times daily with meals. CBG < 70: implement hypoglycemia protocol  - CBG 70 - 120: 0 units  - CBG 121 - 150: 2 units  - CBG 151 - 200: 3 units  - CBG 201 - 250: 5 units  - CBG 251 - 300: 8 units  - CBG 301 - 350: 11 units  - CBG 351 - 400: 15 units  - CBG > 400: call MD and obtain STAT lab verification     insulin detemir 100 UNIT/ML injection  Commonly known as:  LEVEMIR  Inject 0.2 mLs (20 Units total) into the skin at bedtime.     isosorbide dinitrate 30 MG tablet  Commonly known as:  ISORDIL  Take 30 mg by mouth daily.     losartan 50 MG tablet  Commonly known as:  COZAAR  Take 50 mg by mouth daily.     metFORMIN 1000 MG tablet  Commonly known as:  GLUCOPHAGE  Take 1,000 mg by mouth 2 (two) times daily with a meal.      multivitamin-lutein Caps capsule  Take 1 capsule by mouth 2 (two) times daily.     multivitamin with minerals tablet  Take 1 tablet by mouth daily.     omeprazole 40 MG capsule  Commonly known as:  PRILOSEC  Take 40 mg by mouth daily.     potassium chloride SA 20 MEQ tablet  Commonly known as:  K-DUR,KLOR-CON  Take 20 mEq by mouth daily.     traMADol 50 MG tablet  Commonly known as:  ULTRAM  Take 1 tablet (50 mg total) by mouth every 6 (six) hours as needed.     vitamin C 1000 MG tablet  Take 1,000 mg by mouth daily.        No orders of the defined types were placed in this encounter.     There is no immunization history on file for this patient.  History  Substance Use Topics  . Smoking status: Never Smoker   . Smokeless tobacco: Never Used  . Alcohol Use: No    Family history is noncontributory    Review of Systems  DATA OBTAINED: from patient, nurse GENERAL:  Transitory fever-no  appetite changes SKIN: No itching, rash or wounds EYES: No eye pain, redness, discharge EARS: No earache, tinnitus, change in hearing NOSE: No congestion, drainage or bleeding  MOUTH/THROAT: No mouth or tooth pain, occasional sore throat she thinks is from coughing RESPIRATORY: Has a dry cough-intermittent wheezing-does not complaining of acute shortness of breath CARDIAC: No chest pain, palpitations, lower extremity edema  GI: No abdominal pain, No N/V/D or constipation, No heartburn or reflux  GU: No dysuria, frequency or urgency, or incontinence  MUSCULOSKELETAL: No unrelieved bone/joint pain NEUROLOGIC: No headache, dizziness; LE neuropathy PSYCHIATRIC: No overt anxiety or sadness, No behavior issue.                       Physical Exam Temperature 98.1 pulse 86 respirations 18 blood pressure 111/56 GENERAL APPEARANCE: Alert, conversant,  No acute distress.  SKIN: No diaphoresis rash somewhat pale HEAD: Normocephalic, atraumatic  EYES: Conjunctiva/lids clear. Pupils  round, reactive. EOMs intact.  EARS: External exam WNL, canals clear. Hearing grossly normal.  NOSE: No deformity or discharge.  MOUTH/THROAT: Lips w/o lesions oropharynx is clear mucous membranes moist RESPIRATORY: Breathing is even, unlabored. Lung sounds are clear but has had some wheezing before nebulizer treatment   CARDIOVASCULAR: Heart RRR no murmurs, rubs or gallops.trace- 1+ peripheral edema.   GASTROINTESTINAL: Abdomen is soft, non-tender, not distended w/ normal bowel sounds. GENITOURINARY: Bladder non tender, not distended  MUSCULOSKELETAL: No abnormal joints or musculature she is ambulatory but somewhat weak NEUROLOGIC:  Cranial nerves 2-12 grossly intact. Moves all extremities  PSYCHIATRIC: Mood and affect appropriate to situation, no behavioral issues  Patient Active Problem List   Diagnosis Date Noted  . E-coli UTI 02/02/2015  . Sepsis due to Escherichia coli 01/27/2015  . Atypical chest pain 01/07/2014  . GERD (gastroesophageal reflux disease) 01/07/2014  . Chest pain 12/07/2013  . CAD (coronary artery disease) 12/07/2013  . Hyperlipidemia with target LDL less than 70 12/07/2013  . Diabetes type 2, uncontrolled 12/07/2013    CBC    Component Value Date/Time   WBC 7.1 02/01/2015 0455   RBC 3.38* 02/01/2015 0455   HGB 10.7* 02/01/2015 0455   HCT 30.8* 02/01/2015 0455   PLT 145* 02/01/2015 0455   MCV 91.1 02/01/2015 0455   LYMPHSABS 0.4* 01/27/2015 1930   MONOABS 0.9 01/27/2015 1930   EOSABS 0.0 01/27/2015 1930   BASOSABS 0.0 01/27/2015 1930    CMP     Component Value Date/Time   NA 139 02/01/2015 0455   K 3.3* 02/01/2015 0455   CL 103 02/01/2015 0455   CO2 30 02/01/2015 0455   GLUCOSE 79 02/01/2015 0455   BUN 8 02/01/2015 0455   CREATININE 0.95 02/01/2015 0455   CALCIUM 7.5* 02/01/2015 0455   PROT 5.4* 01/27/2015 1930   ALBUMIN 2.6* 01/27/2015 1930   AST 28 01/27/2015 1930   ALT 22 01/27/2015 1930   ALKPHOS 79 01/27/2015 1930   BILITOT 1.3*  01/27/2015 1930   GFRNONAA 57* 02/01/2015 0455   GFRAA 66* 02/01/2015 0455    Assessment and Plan   Fever of unknown origin with cough congestion-challenging situation patient is already on IV Rocephin--clinically there would be high suspicion for a respiratory infection---this was discussed with Dr. Lyn Hollingshead via phone and will start Levaquin 500 milligrams a day for 7 days also will extend the Rocephin for now for 5 additional days.  Obtain a chest x-ray-also a CBC with differential and basic metabolic panel has been ordered we are awaiting those results.  We will make albuterol nebulizers routine every 6 hours for 48 hours and then when necessary-also will change her Tessalon pearls to 3 times a day routine for 72 hours to see if this will help with her cough apparently has been somewhat resistant to the Robitussin  She will have to be monitored carefully with vital signs pulse ox every shift.  At this point appears to be stable.    Sepsis due to Escherichia coli E. Coli;treated with IV rocephin and d/c on rocephin with PICC until 3/4--- secondary to fever will extend Rocephin for 5 additional days   E-coli UTI As per sepsis, treated with IV rocephin    Diabetes type 2, uncontrolled Stated as uncontrolled 2/2 med non compliance -A1c was 11.7- but controlled well on basal insulin in hospital; pt was d/c on glucophage 1000 mg BID and levemir 20 u qHS and SSI; also ARB and statin Sugar this morning was 2:15 this appears to be relatively stable   Hyperlipidemia with target LDL less than 70 Pt put on lipitor 80 and  zetia 10 mg, no labs   GERD (gastroesophageal reflux disease) Continue omeprazole 40 mg daily   CAD (coronary artery disease) Pt in ASA, isordil, statin, ARB   [id number]-of note greater than 35 minutes spent assessing patient-discussing her status extensively with nursing staff-reviewing her chart-and coordinating and formulating a plan of care for numerous  diagnoses-of note greater than 50% of time spent coordinating plan of care

## 2015-02-14 ENCOUNTER — Non-Acute Institutional Stay (SKILLED_NURSING_FACILITY): Payer: Medicare Other | Admitting: Internal Medicine

## 2015-02-14 ENCOUNTER — Encounter: Payer: Self-pay | Admitting: Internal Medicine

## 2015-02-14 DIAGNOSIS — K219 Gastro-esophageal reflux disease without esophagitis: Secondary | ICD-10-CM

## 2015-02-14 DIAGNOSIS — A4151 Sepsis due to Escherichia coli [E. coli]: Secondary | ICD-10-CM | POA: Diagnosis not present

## 2015-02-14 DIAGNOSIS — R059 Cough, unspecified: Secondary | ICD-10-CM

## 2015-02-14 DIAGNOSIS — E1165 Type 2 diabetes mellitus with hyperglycemia: Secondary | ICD-10-CM | POA: Diagnosis not present

## 2015-02-14 DIAGNOSIS — J069 Acute upper respiratory infection, unspecified: Secondary | ICD-10-CM

## 2015-02-14 DIAGNOSIS — IMO0002 Reserved for concepts with insufficient information to code with codable children: Secondary | ICD-10-CM

## 2015-02-14 DIAGNOSIS — R05 Cough: Secondary | ICD-10-CM | POA: Diagnosis not present

## 2015-02-14 DIAGNOSIS — I10 Essential (primary) hypertension: Secondary | ICD-10-CM | POA: Diagnosis not present

## 2015-02-14 NOTE — Progress Notes (Signed)
Patient ID: Lisa Gardner, female   DOB: 1936-01-28, 79 y.o.   MRN: [id number]   Level care skilled. This is a discharge note  Facility [facility]  Allergies: Garlic; Kiwi extract; and Vitamin e  Chief Complaint  Patient presents with  .  discharge note     HPI: Patient is 79 y.o. female who is admitted to SNF to continue IV abx for urosepsis until 3/4 and for generalized weaknes for OT/PT.--She is completing a course of Rocephin--this was extended for 5 additional days late last week secondary to concerns of cough and congestion and a fever.  Chest x-ray also was ordered which did not show any acute process.  In addition she was started on a short course of Levaquin for concerns of URI/bronchitis.  She has been afebrile now for several days appears to be stable she still has a cough at times she is on [employer]   She is slated for discharge later this week she will be going to a nursing home in [place] which would be closer to her son.  She will need nursing PT and OT support-she does have a PICC line  He is a type II diabetic on Levemir 20 units a day as well as sliding scale insulin and Glucophage 1000 mg twice a day---blood sugars in the morning appear to run largely in the 100s range-later in the day I do see somewhat frequent 200s-however wouldnot be real aggressive with any medication changes secondary to her being discharged   Also noted at times she will have a low systolic in the 80s and 90s she is on Losartan 50 mg a day-blood pressures range from the 90s to 120s systolically from what I can see--I couldn't manually today while lying down and got 102/60-however standing up she dropped to 70/45-she does report some dizziness although she says this is chronic and has been going on for some time-- suspect we can reduce the losartan  and have this monitored --this obviously will have to be reevaluated by her primary care physician in [place]  Past Medical History    Diagnosis Date  . TIA (transient ischemic attack) 2009 and 2011  . COPD (chronic obstructive pulmonary disease)   . Diabetes mellitus without complication     insulin dependant  . GERD (gastroesophageal reflux disease)   . Diabetic neuropathy   . Neuromuscular disorder     Lumber Four-Five  . Coronary artery disease 2008    BY CATH, NON OBSTRUCTIVE, last nuc 2013 no ischemia EF 57%  . Hyperlipidemia LDL goal < 70     Past Surgical History  Procedure Laterality Date  . Back surgery  1998, 1975    Lumbar  . Joint replacement  Feb. 2011/August 2011    Bilateral Knees   . Abdominal hysterectomy  1975    Partial, still has ovaries.  . Tonsillectomy  1949  . Cardiac catheterization  2008    30% LM, 40% diag, 50% RCA      Medication List                      aspirin EC 81 MG tablet  Take 81 mg by mouth at bedtime.     atorvastatin 80 MG tablet  Commonly known as:  LIPITOR  Take 80 mg by mouth daily.     benzonatate 200 MG capsule  Commonly known as:  TESSALON  Take 1 capsule (200 mg total) by mouth 3 (three) times daily  as needed for cough.     calcium-vitamin D 250-125 MG-UNIT per tablet  Commonly known as:  OSCAL WITH D  Take 2 tablets by mouth daily.     cefTRIAXone 40 MG/ML IVPB  Commonly known as:  ROCEPHIN  Inject 50 mLs (2 g total) into the vein daily.     CINNAMON PO  Take 2 tablets by mouth daily.     DULoxetine 60 MG capsule  Commonly known as:  CYMBALTA  Take 60 mg by mouth daily.     ezetimibe 10 MG tablet  Commonly known as:  ZETIA  Take 10 mg by mouth daily.     ferrous sulfate 325 (65 FE) MG tablet  Take 325 mg by mouth daily with breakfast.     furosemide 80 MG tablet  Commonly known as:  LASIX  Take 80-160 mg by mouth daily. Take 1 tablet by mouth alternating with 2 tablets every other day     gabapentin 300 MG capsule  Commonly known as:  NEURONTIN  Take 300 mg by mouth 3 (three) times daily.     insulin aspart 100 UNIT/ML  injection  Commonly known as:  novoLOG  - Inject 0-15 Units into the skin 3 (three) times daily with meals. CBG < 70: implement hypoglycemia protocol  - CBG 70 - 120: 0 units  - CBG 121 - 150: 2 units  - CBG 151 - 200: 3 units  - CBG 201 - 250: 5 units  - CBG 251 - 300: 8 units  - CBG 301 - 350: 11 units  - CBG 351 - 400: 15 units  - CBG > 400: call MD and obtain STAT lab verification     insulin detemir 100 UNIT/ML injection  Commonly known as:  LEVEMIR  Inject 0.2 mLs (20 Units total) into the skin at bedtime.     isosorbide dinitrate 30 MG tablet  Commonly known as:  ISORDIL  Take 30 mg by mouth daily.     losartan 50 MG tablet  Commonly known as:  COZAAR  Take 50 mg by mouth daily.     metFORMIN 1000 MG tablet  Commonly known as:  GLUCOPHAGE  Take 1,000 mg by mouth 2 (two) times daily with a meal.     multivitamin-lutein Caps capsule  Take 1 capsule by mouth 2 (two) times daily.     multivitamin with minerals tablet  Take 1 tablet by mouth daily.     omeprazole 40 MG capsule  Commonly known as:  PRILOSEC  Take 40 mg by mouth daily.     potassium chloride SA 20 MEQ tablet  Commonly known as:  K-DUR,KLOR-CON  Take 20 mEq by mouth daily.     traMADol 50 MG tablet  Commonly known as:  ULTRAM  Take 1 tablet (50 mg total) by mouth every 6 (six) hours as needed.     vitamin C 1000 MG tablet  Take 1,000 mg by mouth daily.        No orders of the defined types were placed in this encounter.     There is no immunization history on file for this patient.  History  Substance Use Topics  . Smoking status: Never Smoker   . Smokeless tobacco: Never Used  . Alcohol Use: No    Family history is noncontributory    Review of Systems  DATA OBTAINED: from patient, nurse GENERAL:  Transitory fever-no  appetite changes SKIN: No itching, rash or wounds EYES: No eye pain,  redness, discharge EARS: No earache, tinnitus, change in hearing NOSE: No congestion,  drainage or bleeding  MOUTH/THROAT: No mouth or tooth pain, occasional sore throat she thinks is from coughing RESPIRATORY: Has a dry cough-intermittent wheezing-does not complaining of acute shortness of breath CARDIAC: No chest pain, palpitations, lower extremity edema  GI: No abdominal pain, No N/V/D or constipation, No heartburn or reflux  GU: No dysuria, frequency or urgency, or incontinence  MUSCULOSKELETAL: No unrelieved bone/joint pain NEUROLOGIC: No headache, dizziness; LE neuropathy PSYCHIATRIC: No overt anxiety or sadness, No behavior issue.                       Physical Exam Temperature 97.4 pulse 74 respirations 20 blood pressure 126/70  GENERAL APPEARANCE: Alert, conversant,  No acute distress.  SKIN: No diaphoresis rash somewhat pale HEAD: Normocephalic, atraumatic  EYES: Conjunctiva/lids clear. Pupils round, reactive. EOMs intact.  EARS: External exam WNL, canals clear. Hearing grossly normal.  NOSE: No deformity or discharge.  MOUTH/THROAT: Lips w/o lesions oropharynx is clear mucous membranes moist RESPIRATORY: Breathing is even, unlabored. Lung sounds are clear but has had some wheezing before nebulizer treatment   CARDIOVASCULAR: Heart RRR no murmurs, rubs or gallops.trace- 1+ peripheral edema.   GASTROINTESTINAL: Abdomen is soft, non-tender, not distended w/ normal bowel sounds. GENITOURINARY: Bladder non tender, not distended  MUSCULOSKELETAL: No abnormal joints or musculature she is ambulatory but somewhat weak NEUROLOGIC:  Cranial nerves 2-12 grossly intact. Moves all extremities  PSYCHIATRIC: Mood and affect appropriate to situation, no behavioral issues  Patient Active Problem List   Diagnosis Date Noted  . E-coli UTI 02/02/2015  . Sepsis due to Escherichia coli 01/27/2015  . Atypical chest pain 01/07/2014  . GERD (gastroesophageal reflux disease) 01/07/2014  . Chest pain 12/07/2013  . CAD (coronary artery disease) 12/07/2013  . Hyperlipidemia  with target LDL less than 70 12/07/2013  . Diabetes type 2, uncontrolled 12/07/2013    CBC  02/10/2015.  WBC 3.4 hemoglobin 11.1 platelets 214.      Component Value Date/Time   WBC 7.1 02/01/2015 0455   RBC 3.38* 02/01/2015 0455   HGB 10.7* 02/01/2015 0455   HCT 30.8* 02/01/2015 0455   PLT 145* 02/01/2015 0455   MCV 91.1 02/01/2015 0455   LYMPHSABS 0.4* 01/27/2015 1930   MONOABS 0.9 01/27/2015 1930   EOSABS 0.0 01/27/2015 1930   BASOSABS 0.0 01/27/2015 1930    CMP  BMP on 02/10/2015.  Sodium 134 potassium 3.9 BUN 13 creatinine 0.94      Component Value Date/Time   NA 139 02/01/2015 0455   K 3.3* 02/01/2015 0455   CL 103 02/01/2015 0455   CO2 30 02/01/2015 0455   GLUCOSE 79 02/01/2015 0455   BUN 8 02/01/2015 0455   CREATININE 0.95 02/01/2015 0455   CALCIUM 7.5* 02/01/2015 0455   PROT 5.4* 01/27/2015 1930   ALBUMIN 2.6* 01/27/2015 1930   AST 28 01/27/2015 1930   ALT 22 01/27/2015 1930   ALKPHOS 79 01/27/2015 1930   BILITOT 1.3* 01/27/2015 1930   GFRNONAA 57* 02/01/2015 0455   GFRAA 66* 02/01/2015 0455    Assessment and Plan   Fever of unknown origin with cough congestion-was suspicion for bronchitis Rocephin course was extended for 5 additional days she is also completing  a seven-day course of Levaquin-this appears to be stable she has been afebrile still has a cough at times she is receiving Tessalon Perles also has albuterol nebulizers when necessary     Sepsis  due to Escherichia coli E. Coli;treated with IV rocephin and d/c on rocephin with PICC until 3/4--- secondary to fever -- extended Rocephin for 5 additional days   E-coli UTI As per sepsis, treated with IV rocephin    Diabetes type 2, uncontrolled Stated as uncontrolled 2/2 med non compliance -A1c was 11.7- but controlled well on basal insulin in hospital; pt was d/c on glucophage 1000 mg BID and levemir 20 u qHS and SSI; also ARB and statin Sugars as noted above --at this point will monitor  since she is about to be discharged-this will warrant follow up by PCP in [place]-at this point would be hesitant to be real aggressive with concerns of hypoglycemia during her transition to [place]    Hyperlipidemia with target LDL less than 70 Pt put on lipitor 80 and zetia 10 mg,   GERD (gastroesophageal reflux disease) Continue omeprazole 40 mg daily   CAD (coronary artery disease) Pt in ASA, isordil, statin, ARB-----as noted above she does appear to have some low blood pressure readings will decrease her losartan to 25 mg a day hold for systolic less than 105--this was discussed with Dr. Lyn Hollingshead via phone  Again patient will need PT and OT as well as nursing and home health support She does state her husband is actually in the hospital so her discharge may be delayed somewhat-apparently she and her husband were going to go with their son to [place] where he lives.  Again this is somewhat of a state of flux day and meantime her blood sugars and blood pressures will have to be monitored carefully  --[id number] of note greater than 30 minutes spent on this discharge summary

## 2015-03-02 ENCOUNTER — Other Ambulatory Visit (HOSPITAL_COMMUNITY): Payer: Self-pay | Admitting: *Deleted

## 2015-03-03 ENCOUNTER — Ambulatory Visit (HOSPITAL_COMMUNITY)
Admission: RE | Admit: 2015-03-03 | Discharge: 2015-03-03 | Disposition: A | Discharge status: Home / Self Care | Payer: Medicare Other | Source: Ambulatory Visit | Attending: Internal Medicine | Admitting: Internal Medicine

## 2015-03-03 DIAGNOSIS — M810 Age-related osteoporosis without current pathological fracture: Secondary | ICD-10-CM | POA: Diagnosis not present

## 2015-03-03 MED ORDER — ZOLEDRONIC ACID 5 MG/100ML IV SOLN
INTRAVENOUS | Status: AC
  Administered 2015-03-03: 5 mg
  Filled 2015-03-03: qty 100

## 2015-03-03 MED ORDER — ZOLEDRONIC ACID 5 MG/100ML IV SOLN: 5.0000 mg | Freq: Once | INTRAVENOUS | Status: DC
